# Patient Record
Sex: Female | Born: 1955 | Race: Black or African American | Hispanic: No | Marital: Married | Smoking: Former smoker
Health system: Southern US, Community
[De-identification: ages and names within clinical notes are randomized; demographics above are authoritative.]

## PROBLEM LIST (undated history)

## (undated) DIAGNOSIS — E785 Hyperlipidemia, unspecified: Secondary | ICD-10-CM

## (undated) DIAGNOSIS — H409 Unspecified glaucoma: Secondary | ICD-10-CM

## (undated) DIAGNOSIS — I10 Essential (primary) hypertension: Secondary | ICD-10-CM

## (undated) DIAGNOSIS — E119 Type 2 diabetes mellitus without complications: Secondary | ICD-10-CM

## (undated) DIAGNOSIS — M1990 Unspecified osteoarthritis, unspecified site: Secondary | ICD-10-CM

## (undated) HISTORY — PX: ESOPHAGOGASTRODUODENOSCOPY: SHX1529

## (undated) HISTORY — PX: ABDOMINAL HYSTERECTOMY: SHX81

## (undated) HISTORY — PX: COLONOSCOPY: SHX174

---

## 2004-08-02 ENCOUNTER — Ambulatory Visit: Payer: Self-pay | Admitting: Unknown Physician Specialty

## 2005-08-07 ENCOUNTER — Ambulatory Visit: Payer: Self-pay | Admitting: Unknown Physician Specialty

## 2005-09-09 ENCOUNTER — Ambulatory Visit: Payer: Self-pay | Admitting: Gastroenterology

## 2006-08-27 ENCOUNTER — Ambulatory Visit: Payer: Self-pay | Admitting: Unknown Physician Specialty

## 2007-09-08 ENCOUNTER — Ambulatory Visit: Payer: Self-pay | Admitting: Unknown Physician Specialty

## 2008-09-13 ENCOUNTER — Ambulatory Visit: Payer: Self-pay | Admitting: Unknown Physician Specialty

## 2009-10-04 ENCOUNTER — Ambulatory Visit: Payer: Self-pay | Admitting: Unknown Physician Specialty

## 2009-11-13 ENCOUNTER — Ambulatory Visit: Payer: Self-pay | Admitting: Gastroenterology

## 2010-10-30 ENCOUNTER — Ambulatory Visit: Payer: Self-pay | Admitting: Unknown Physician Specialty

## 2011-12-20 ENCOUNTER — Ambulatory Visit: Payer: Self-pay | Admitting: Unknown Physician Specialty

## 2012-12-21 ENCOUNTER — Ambulatory Visit: Payer: Self-pay | Admitting: Internal Medicine

## 2014-02-02 ENCOUNTER — Ambulatory Visit: Payer: Self-pay | Admitting: Internal Medicine

## 2014-03-28 ENCOUNTER — Ambulatory Visit: Payer: Self-pay | Admitting: Gastroenterology

## 2014-05-02 DIAGNOSIS — I10 Essential (primary) hypertension: Secondary | ICD-10-CM | POA: Insufficient documentation

## 2014-05-23 LAB — SURGICAL PATHOLOGY

## 2014-10-18 ENCOUNTER — Other Ambulatory Visit: Payer: Self-pay | Admitting: Internal Medicine

## 2014-10-18 DIAGNOSIS — E7800 Pure hypercholesterolemia, unspecified: Secondary | ICD-10-CM | POA: Insufficient documentation

## 2014-10-18 DIAGNOSIS — K219 Gastro-esophageal reflux disease without esophagitis: Secondary | ICD-10-CM | POA: Insufficient documentation

## 2014-10-18 DIAGNOSIS — Z794 Long term (current) use of insulin: Secondary | ICD-10-CM | POA: Insufficient documentation

## 2014-10-18 DIAGNOSIS — Z1239 Encounter for other screening for malignant neoplasm of breast: Secondary | ICD-10-CM

## 2015-01-10 DIAGNOSIS — M1611 Unilateral primary osteoarthritis, right hip: Secondary | ICD-10-CM | POA: Insufficient documentation

## 2015-02-06 ENCOUNTER — Ambulatory Visit: Payer: Self-pay

## 2015-02-10 ENCOUNTER — Ambulatory Visit
Admission: RE | Admit: 2015-02-10 | Discharge: 2015-02-10 | Disposition: A | Discharge status: Home / Self Care | Payer: BLUE CROSS/BLUE SHIELD | Source: Ambulatory Visit | Attending: Internal Medicine | Admitting: Internal Medicine

## 2015-02-10 DIAGNOSIS — Z1231 Encounter for screening mammogram for malignant neoplasm of breast: Secondary | ICD-10-CM | POA: Diagnosis not present

## 2015-02-10 DIAGNOSIS — Z1239 Encounter for other screening for malignant neoplasm of breast: Secondary | ICD-10-CM

## 2015-03-04 ENCOUNTER — Encounter: Payer: Self-pay | Admitting: Emergency Medicine

## 2015-03-04 ENCOUNTER — Emergency Department
Admission: EM | Admit: 2015-03-04 | Discharge: 2015-03-04 | Disposition: A | Discharge status: Home / Self Care | Payer: BLUE CROSS/BLUE SHIELD | Attending: Emergency Medicine | Admitting: Emergency Medicine

## 2015-03-04 DIAGNOSIS — I10 Essential (primary) hypertension: Secondary | ICD-10-CM | POA: Diagnosis present

## 2015-03-04 DIAGNOSIS — E119 Type 2 diabetes mellitus without complications: Secondary | ICD-10-CM | POA: Diagnosis not present

## 2015-03-04 HISTORY — DX: Unspecified osteoarthritis, unspecified site: M19.90

## 2015-03-04 HISTORY — DX: Essential (primary) hypertension: I10

## 2015-03-04 HISTORY — DX: Type 2 diabetes mellitus without complications: E11.9

## 2015-03-04 NOTE — ED Provider Notes (Signed)
[facility] Emergency Department Provider Note  ____________________________________________  Time seen: Approximately 1:40 PM  I have reviewed the triage vital signs and the nursing notes.   HISTORY  Chief Complaint Hypertension    HPI Karla Hopkins is a 60 y.o. female patient complaining of blood pressure being elevated in the evening. Patient state associated with a slight headache which resolved on a blood pressure returned back to normal readings. Patient states she is tried to call her doctor left the message but not heard back from the office. Patient denies any chest pain shortness of breath or vision disturbance.Patient denies any pain. Patient has a home risks monitoring apparatus for her blood pressure. Since the patient is normotensive at this time we will compare with additional reading with the ED and home monitoring apparatus. .  Past Medical History  Diagnosis Date  . Hypertension   . Diabetes mellitus without complication ([place])   . Arthritis     There are no active problems to display for this patient.   Past Surgical History  Procedure Laterality Date  . Abdominal hysterectomy      No current outpatient prescriptions on file.  Allergies Review of patient's allergies indicates no known allergies.  Family History  Problem Relation Age of Onset  . Pancreatic cancer Mother   . Lung cancer Father   . Breast cancer Sister 60  . Breast cancer Sister 73  . Breast cancer Sister 7  . Lung cancer Brother     Social History Social History  Substance Use Topics  . Smoking status: Never Smoker   . Smokeless tobacco: None  . Alcohol Use: No    Review of Systems Constitutional: No fever/chills Eyes: No visual changes. ENT: No sore throat. Cardiovascular: Denies chest pain. Respiratory: Denies shortness of breath. Gastrointestinal: No abdominal pain.  No nausea, no vomiting.  No diarrhea.  No constipation. Genitourinary:  Negative for dysuria. Musculoskeletal: Negative for back pain. Skin: Negative for rash. Neurological: Negative for headaches, focal weakness or numbness. Endocrine: Diabetes and hypertension ____________________________________________   PHYSICAL EXAM:  VITAL SIGNS: ED Triage Vitals  Enc Vitals Group     BP 03/04/15 1230 186/79 mmHg     Pulse Rate 03/04/15 1230 88     Resp 03/04/15 1230 18     Temp 03/04/15 1230 97.5 F (36.4 C)     Temp Source 03/04/15 1230 Oral     SpO2 03/04/15 1230 100 %     Weight 03/04/15 1230 188 lb (85.276 kg)     Height 03/04/15 1230 5' (1.524 m)     Head Cir --      Peak Flow --      Pain Score --      Pain Loc --      Pain Edu? --      Excl. in [place]? --     Constitutional: Alert and oriented. Well appearing and in no acute distress. Eyes: Conjunctivae are normal. PERRL. EOMI. Head: Atraumatic. Nose: No congestion/rhinnorhea. Mouth/Throat: Mucous membranes are moist.  Oropharynx non-erythematous. Neck: No stridor.  No cervical spine tenderness to palpation. Hematological/Lymphatic/Immunilogical: No cervical lymphadenopathy. Cardiovascular: Normal rate, regular rhythm. Grossly normal heart sounds.  Good peripheral circulation. Respiratory: Normal respiratory effort.  No retractions. Lungs CTAB. Gastrointestinal: Soft and nontender. No distention. No abdominal bruits. No CVA tenderness. Musculoskeletal: No lower extremity tenderness nor edema.  No joint effusions. Neurologic:  Normal speech and language. No gross focal neurologic deficits are appreciated. No gait instability. Skin:  Skin is warm, dry and intact. No rash noted. Psychiatric: Mood and affect are normal. Speech and behavior are normal.  ____________________________________________   LABS (all labs ordered are listed, but only abnormal results are displayed)  Labs Reviewed - No data to  display ____________________________________________  EKG   ____________________________________________  RADIOLOGY  ____________________________________________   PROCEDURES  Procedure(s) performed: None  Critical Care performed: No  ____________________________________________   INITIAL IMPRESSION / ASSESSMENT AND PLAN / ED COURSE  Pertinent labs & imaging results that were available during my care of the patient were reviewed by me and considered in my medical decision making (see chart for details).  Hypertension. Advised patient her readings are high on a home machine secondary to the pain is too narrow for her extremity. Advised patient to follow-up with family doctor and continue previous medications. Discharge instructions given and discharged BP is 147/80. ____________________________________________   FINAL CLINICAL IMPRESSION(S) / ED DIAGNOSES  Final diagnoses:  Essential hypertension      Sable Feil, PA-C 03/04/15 1416  Lisa Roca, MD 03/04/15 [phone]

## 2015-03-04 NOTE — ED Notes (Signed)
bp 147/80 after sitting in triage a few minutes.

## 2015-03-04 NOTE — ED Notes (Addendum)
Per PA Ron I checked blood pressure with patient's home wrist machine, and then our  BP machine.  Patient's home wrist machine was 198/96, and taken on forearm with patient's machine was 205/80, letting her know she should not attempt to take it on her forearm.  Our machine showed with small cuff wrapped tight like her wrist machine was 183/91.  Our machine on patient upper arm with proper sized cuff was 162/73.  Out come was determined that patient needs a larger wrist home BP machine for more accurate reading.

## 2015-03-04 NOTE — Discharge Instructions (Signed)
Continue previous medications and follow-up family doctor How to Take Your Blood Pressure HOW DO I GET A BLOOD PRESSURE MACHINE?  You can buy an electronic home blood pressure machine at your local pharmacy. Insurance will sometimes cover the cost if you have a prescription.  Ask your doctor what type of machine is best for you. There are different machines for your arm and your wrist.  If you decide to buy a machine to check your blood pressure on your arm, first check the size of your arm so you can buy the right size cuff. To check the size of your arm:   Use a measuring tape that shows both inches and centimeters.   Wrap the measuring tape around the upper-middle part of your arm. You may need someone to help you measure.   Write down your arm measurement in both inches and centimeters.   To measure your blood pressure correctly, it is important to have the right size cuff.   If your arm is up to 13 inches (up to 34 centimeters), get an adult cuff size.  If your arm is 13 to 17 inches (35 to 44 centimeters), get a large adult cuff size.    If your arm is 17 to 20 inches (45 to 52 centimeters), get an adult thigh cuff.  WHAT DO THE NUMBERS MEAN?   There are two numbers that make up your blood pressure. For example: 120/80.  The first number (120 in our example) is called the "systolic pressure." It is a measure of the pressure in your blood vessels when your heart is pumping blood.  The second number (80 in our example) is called the "diastolic pressure." It is a measure of the pressure in your blood vessels when your heart is resting between beats.  Your doctor will tell you what your blood pressure should be. WHAT SHOULD I DO BEFORE I CHECK MY BLOOD PRESSURE?   Try to rest or relax for at least 30 minutes before you check your blood pressure.  Do not smoke.  Do not have any drinks with caffeine, such as:  Soda.  Coffee.  Tea.  Check your blood pressure in a  quiet room.  Sit down and stretch out your arm on a table. Keep your arm at about the level of your heart. Let your arm relax.  Make sure that your legs are not crossed. HOW DO I CHECK MY BLOOD PRESSURE?  Follow the directions that came with your machine.  Make sure you remove any tight-fitting clothing from your arm or wrist. Wrap the cuff around your upper arm or wrist. You should be able to fit a finger between the cuff and your arm. If you cannot fit a finger between the cuff and your arm, it is too tight and should be removed and rewrapped.  Some units require you to manually pump up the arm cuff.  Automatic units inflate the cuff when you press a button.  Cuff deflation is automatic in both models.  After the cuff is inflated, the unit measures your blood pressure and pulse. The readings are shown on a monitor. Hold still and breathe normally while the cuff is inflated.  Getting a reading takes less than a minute.  Some models store readings in a memory. Some provide a printout of readings. If your machine does not store your readings, keep a written record.  Take readings with you to your next visit with your doctor.   This information is  not intended to replace advice given to you by your health care provider. Make sure you discuss any questions you have with your health care provider.   Document Released: 12/28/2007 Document Revised: 02/04/2014 Document Reviewed: 03/11/2013 Elsevier Interactive Patient Education [employer].

## 2015-03-04 NOTE — ED Notes (Signed)
C/o blood pressure being elevated in the evenings.  Will have slight headache in evening with elevation and then it comes down.  Called doctor during week but did not hear back.  Denies CP, SHOB, or vision changes.

## 2015-05-02 ENCOUNTER — Emergency Department
Admission: EM | Admit: 2015-05-02 | Discharge: 2015-05-02 | Disposition: A | Discharge status: Home / Self Care | Payer: Worker's Compensation | Attending: Emergency Medicine | Admitting: Emergency Medicine

## 2015-05-02 ENCOUNTER — Emergency Department: Payer: Worker's Compensation

## 2015-05-02 ENCOUNTER — Encounter: Payer: Self-pay | Admitting: Emergency Medicine

## 2015-05-02 DIAGNOSIS — M1990 Unspecified osteoarthritis, unspecified site: Secondary | ICD-10-CM | POA: Diagnosis not present

## 2015-05-02 DIAGNOSIS — I10 Essential (primary) hypertension: Secondary | ICD-10-CM | POA: Insufficient documentation

## 2015-05-02 DIAGNOSIS — Y9269 Other specified industrial and construction area as the place of occurrence of the external cause: Secondary | ICD-10-CM | POA: Diagnosis not present

## 2015-05-02 DIAGNOSIS — Y9389 Activity, other specified: Secondary | ICD-10-CM | POA: Diagnosis not present

## 2015-05-02 DIAGNOSIS — Y990 Civilian activity done for income or pay: Secondary | ICD-10-CM | POA: Insufficient documentation

## 2015-05-02 DIAGNOSIS — S63512A Sprain of carpal joint of left wrist, initial encounter: Secondary | ICD-10-CM | POA: Diagnosis not present

## 2015-05-02 DIAGNOSIS — E119 Type 2 diabetes mellitus without complications: Secondary | ICD-10-CM | POA: Diagnosis not present

## 2015-05-02 DIAGNOSIS — Z79899 Other long term (current) drug therapy: Secondary | ICD-10-CM | POA: Diagnosis not present

## 2015-05-02 DIAGNOSIS — S63502A Unspecified sprain of left wrist, initial encounter: Secondary | ICD-10-CM

## 2015-05-02 DIAGNOSIS — W010XXA Fall on same level from slipping, tripping and stumbling without subsequent striking against object, initial encounter: Secondary | ICD-10-CM | POA: Insufficient documentation

## 2015-05-02 DIAGNOSIS — Z90710 Acquired absence of both cervix and uterus: Secondary | ICD-10-CM | POA: Diagnosis not present

## 2015-05-02 DIAGNOSIS — S6992XA Unspecified injury of left wrist, hand and finger(s), initial encounter: Secondary | ICD-10-CM | POA: Diagnosis present

## 2015-05-02 DIAGNOSIS — Z7984 Long term (current) use of oral hypoglycemic drugs: Secondary | ICD-10-CM | POA: Insufficient documentation

## 2015-05-02 NOTE — ED Notes (Signed)
See triage note   States she fell yesterday at work  Having pain to left wrist area  Denies any other sx's   Ambulates well to treatment area

## 2015-05-02 NOTE — ED Notes (Signed)
Pt to ed with c/o left wrist pain,  Pt states she fell while at work yesterday.

## 2015-05-02 NOTE — ED Provider Notes (Signed)
[facility] Emergency Department Provider Note  ____________________________________________  Time seen: Approximately 12:30 PM  I have reviewed the triage vital signs and the nursing notes.   HISTORY  Chief Complaint Fall    HPI Karla Hopkins is a 60 y.o. female patient with left his pain secondary to fall yesterday. 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 Patient tripped and fell. Patient broke the fall with left hand and wrist. Patient stated this pain radiating from the ulnar aspect of her wrist to mid forearm. Patient denies any loss of sensation. Patient denies any loss of function. Patient rates pain as 7/10. Patient described a pain as "sharp". No palliative measures taken for this complaint.   Past Medical History  Diagnosis Date  . Hypertension   . Diabetes mellitus without complication ([place])   . Arthritis     There are no active problems to display for this patient.   Past Surgical History  Procedure Laterality Date  . Abdominal hysterectomy      Current Outpatient Rx  Name  Route  Sig  Dispense  Refill  . doxazosin (CARDURA) 8 MG tablet   Oral   Take 8 mg by mouth daily.         . hydrochlorothiazide (HYDRODIURIL) 25 MG tablet   Oral   Take 25 mg by mouth daily.         Marland Kitchen lisinopril (PRINIVIL,ZESTRIL) 40 MG tablet   Oral   Take 40 mg by mouth daily.         . metFORMIN (GLUCOPHAGE) 1000 MG tablet   Oral   Take 1,000 mg by mouth 2 (two) times daily with a meal.         . verapamil (VERELAN PM) 180 MG 24 hr capsule   Oral   Take 180 mg by mouth at bedtime.         . meloxicam (MOBIC) 7.5 MG tablet   Oral   Take 7.5 mg by mouth daily.           Allergies Review of patient's allergies indicates no known allergies.  Family History  Problem Relation Age of Onset  . Pancreatic cancer Mother   . Lung cancer Father   . Breast cancer Sister 14  . Breast cancer Sister 68   . Breast cancer Sister 80  . Lung cancer Brother     Social History Social History  Substance Use Topics  . Smoking status: Never Smoker   . Smokeless tobacco: None  . Alcohol Use: No    Review of Systems Constitutional: No fever/chills Eyes: No visual changes. ENT: No sore throat. Cardiovascular: Denies chest pain. Respiratory: Denies shortness of breath. Gastrointestinal: No abdominal pain.  No nausea, no vomiting.  No diarrhea.  No constipation. Genitourinary: Negative for dysuria. Musculoskeletal: Left wrist pain  Skin: Negative for rash. Neurological: Negative for headaches, focal weakness or numbness. Endocrine:Hypertension and diabetes ____________________________________________   PHYSICAL EXAM:  VITAL SIGNS: ED Triage Vitals  Enc Vitals Group     BP 05/02/15 1216 176/66 mmHg     Pulse Rate 05/02/15 1216 68     Resp 05/02/15 1216 18     Temp 05/02/15 1216 98.1 F (36.7 C)     Temp Source 05/02/15 1216 Oral     SpO2 05/02/15 1216 100 %     Weight 05/02/15 1216 188 lb (85.276 kg)     Height 05/02/15 1216 5\' 1"  (1.549 m)  Head Cir --      Peak Flow --      Pain Score 05/02/15 1216 7     Pain Loc --      Pain Edu? --      Excl. in [place]? --     Constitutional: Alert and oriented. Well appearing and in no acute distress. Eyes: Conjunctivae are normal. PERRL. EOMI. Head: Atraumatic. Nose: No congestion/rhinnorhea. Mouth/Throat: Mucous membranes are moist.  Oropharynx non-erythematous. Neck: No stridor. No cervical spine tenderness to palpation. Hematological/Lymphatic/Immunilogical: No cervical lymphadenopathy. Cardiovascular: Normal rate, regular rhythm. Grossly normal heart sounds.  Good peripheral circulation. Respiratory: Normal respiratory effort.  No retractions. Lungs CTAB. Gastrointestinal: Soft and nontender. No distention. No abdominal bruits. No CVA tenderness. Musculoskeletal: No obvious deformity of the left wrist. Mild edema. Patient has  moderate guarding palpation of distal ulnar. Patient is full equal range of motion. Grip strength is decreased 3/5. Neurologic:  Normal speech and language. No gross focal neurologic deficits are appreciated. No gait instability. Skin:  Skin is warm, dry and intact. No rash noted. Psychiatric: Mood and affect are normal. Speech and behavior are normal.  ____________________________________________   LABS (all labs ordered are listed, but only abnormal results are displayed)  Labs Reviewed - No data to display ____________________________________________  EKG   ____________________________________________  RADIOLOGY  No acute findings on x-ray. I, Sable Feil, personally viewed and evaluated these images (plain radiographs) as part of my medical decision making, as well as reviewing the written report by the radiologist.  ____________________________________________   PROCEDURES  Procedure(s) performed: None  Critical Care performed: No  ____________________________________________   INITIAL IMPRESSION / ASSESSMENT AND PLAN / ED COURSE  Pertinent labs & imaging results that were available during my care of the patient were reviewed by me and considered in my medical decision making (see chart for details).  Contusion right wrist. Discussed x-ray finding with patient. Patient placed in a Velcro wrist splint. Patient given discharge Instructions. Patient advised follow-up with family doctor no improvement 3-5 days. ____________________________________________   FINAL CLINICAL IMPRESSION(S) / ED DIAGNOSES  Final diagnoses:  Sprain of left wrist, initial encounter      Sable Feil, PA-C 05/02/15 [place], PA-C 05/02/15 1340  Lavonia Drafts, MD 05/02/15 [phone]

## 2015-05-02 NOTE — Discharge Instructions (Signed)
Wear wrist support for 3-5 days as needed.

## 2016-01-11 ENCOUNTER — Other Ambulatory Visit: Payer: Self-pay | Admitting: Internal Medicine

## 2016-01-11 DIAGNOSIS — Z1239 Encounter for other screening for malignant neoplasm of breast: Secondary | ICD-10-CM

## 2016-02-16 ENCOUNTER — Ambulatory Visit
Admission: RE | Admit: 2016-02-16 | Discharge: 2016-02-16 | Disposition: A | Discharge status: Home / Self Care | Payer: BLUE CROSS/BLUE SHIELD | Source: Ambulatory Visit | Attending: Internal Medicine | Admitting: Internal Medicine

## 2016-02-16 DIAGNOSIS — Z1231 Encounter for screening mammogram for malignant neoplasm of breast: Secondary | ICD-10-CM | POA: Insufficient documentation

## 2016-02-16 DIAGNOSIS — Z1239 Encounter for other screening for malignant neoplasm of breast: Secondary | ICD-10-CM

## 2016-10-23 DIAGNOSIS — N951 Menopausal and female climacteric states: Secondary | ICD-10-CM | POA: Insufficient documentation

## 2016-10-23 DIAGNOSIS — I10 Essential (primary) hypertension: Secondary | ICD-10-CM | POA: Insufficient documentation

## 2017-01-31 DIAGNOSIS — M1990 Unspecified osteoarthritis, unspecified site: Secondary | ICD-10-CM | POA: Insufficient documentation

## 2017-01-31 DIAGNOSIS — E1165 Type 2 diabetes mellitus with hyperglycemia: Secondary | ICD-10-CM | POA: Insufficient documentation

## 2017-01-31 DIAGNOSIS — Z90710 Acquired absence of both cervix and uterus: Secondary | ICD-10-CM | POA: Insufficient documentation

## 2017-01-31 DIAGNOSIS — E782 Mixed hyperlipidemia: Secondary | ICD-10-CM | POA: Insufficient documentation

## 2017-02-04 ENCOUNTER — Other Ambulatory Visit: Payer: Self-pay | Admitting: Nurse Practitioner

## 2017-02-04 DIAGNOSIS — Z1231 Encounter for screening mammogram for malignant neoplasm of breast: Secondary | ICD-10-CM

## 2017-06-10 DIAGNOSIS — M25561 Pain in right knee: Secondary | ICD-10-CM | POA: Insufficient documentation

## 2017-07-08 DIAGNOSIS — R61 Generalized hyperhidrosis: Secondary | ICD-10-CM | POA: Insufficient documentation

## 2018-01-29 ENCOUNTER — Other Ambulatory Visit: Payer: Self-pay | Admitting: Nurse Practitioner

## 2018-01-29 DIAGNOSIS — Z1231 Encounter for screening mammogram for malignant neoplasm of breast: Secondary | ICD-10-CM

## 2018-02-10 DIAGNOSIS — Z6834 Body mass index (BMI) 34.0-34.9, adult: Secondary | ICD-10-CM | POA: Insufficient documentation

## 2018-04-21 DIAGNOSIS — J309 Allergic rhinitis, unspecified: Secondary | ICD-10-CM | POA: Insufficient documentation

## 2018-08-25 DIAGNOSIS — Z0000 Encounter for general adult medical examination without abnormal findings: Secondary | ICD-10-CM | POA: Insufficient documentation

## 2019-06-07 ENCOUNTER — Other Ambulatory Visit (INDEPENDENT_AMBULATORY_CARE_PROVIDER_SITE_OTHER): Payer: Self-pay | Admitting: Vascular Surgery

## 2019-06-07 DIAGNOSIS — I10 Essential (primary) hypertension: Secondary | ICD-10-CM

## 2019-06-08 ENCOUNTER — Other Ambulatory Visit (INDEPENDENT_AMBULATORY_CARE_PROVIDER_SITE_OTHER): Payer: Self-pay | Admitting: Nurse Practitioner

## 2019-06-08 ENCOUNTER — Encounter (INDEPENDENT_AMBULATORY_CARE_PROVIDER_SITE_OTHER): Payer: Self-pay | Admitting: Nurse Practitioner

## 2019-06-08 ENCOUNTER — Ambulatory Visit (INDEPENDENT_AMBULATORY_CARE_PROVIDER_SITE_OTHER): Payer: Medicare HMO | Admitting: Nurse Practitioner

## 2019-06-08 ENCOUNTER — Ambulatory Visit (INDEPENDENT_AMBULATORY_CARE_PROVIDER_SITE_OTHER): Payer: Medicare HMO

## 2019-06-08 ENCOUNTER — Other Ambulatory Visit: Payer: Self-pay

## 2019-06-08 VITALS — BP 168/91 | HR 62 | Resp 16 | Ht 62.0 in | Wt 195.0 lb

## 2019-06-08 DIAGNOSIS — I10 Essential (primary) hypertension: Secondary | ICD-10-CM

## 2019-06-08 DIAGNOSIS — R0989 Other specified symptoms and signs involving the circulatory and respiratory systems: Secondary | ICD-10-CM

## 2019-06-08 DIAGNOSIS — I771 Stricture of artery: Secondary | ICD-10-CM

## 2019-06-08 DIAGNOSIS — E7800 Pure hypercholesterolemia, unspecified: Secondary | ICD-10-CM

## 2019-06-08 DIAGNOSIS — I701 Atherosclerosis of renal artery: Secondary | ICD-10-CM | POA: Diagnosis not present

## 2019-06-08 NOTE — Progress Notes (Signed)
Subjective:    Patient ID: Karla Hopkins, female    DOB: 10/06/1955, 64 y.o.   MRN: [id number] Chief Complaint  Patient presents with  . New Patient (Initial Visit)    ref [place] uncontrolled HTN    Patient presents today as a referral from Dr. Clayborn Bigness in regards to poorly controlled hypertension.  The patient states that she has been on 3 blood pressure medications without any change in her blood pressure.  The patient does note that this has been ongoing for years.  The patient denies any known issues with her renal function.  Patient also does note that sometimes she has different blood pressures in each arm.  Today the patient's right lower extremity have blood pressures in the 99991111 systolic whereas the left was [id number] systolic.  Patient denies smoking or tobacco usage.  She denies any fever, chills, nausea, vomiting or diarrhea.  She denies any strokelike symptoms.  She denies any TIA-like symptoms.  She denies any claudication or rest pain like symptoms.  Today noninvasive studies show that the patient has normal size kidneys bilaterally.  She has an aortic diameter of 2.2 cm.  The studies estimate the renal artery stenosis to be between 1 and 59%.  However the highest velocity bilaterally is less than 150.   Review of Systems  All other systems reviewed and are negative.      Objective:   Physical Exam Vitals reviewed.  Constitutional:      Appearance: Normal appearance.  Neck:     Vascular: Carotid bruit present.  Cardiovascular:     Rate and Rhythm: Normal rate and regular rhythm.     Pulses: Normal pulses.     Heart sounds: Normal heart sounds.  Pulmonary:     Effort: Pulmonary effort is normal.     Breath sounds: Normal breath sounds.  Skin:    General: Skin is warm and dry.  Neurological:     Mental Status: She is alert and oriented to person, place, and time.  Psychiatric:        Mood and Affect: Mood normal.        Behavior: Behavior normal.        Thought  Content: Thought content normal.        Judgment: Judgment normal.     BP (!) 168/91 (BP Location: Left Arm)   Pulse 62   Resp 16   Ht 5\' 2"  (1.575 m)   Wt 195 lb (88.5 kg)   BMI 35.67 kg/m   Past Medical History:  Diagnosis Date  . Arthritis   . Diabetes mellitus without complication ([place])   . Hypertension     Social History   Socioeconomic History  . Marital status: Married    Spouse name: Not on file  . Number of children: Not on file  . Years of education: Not on file  . Highest education level: Not on file  Occupational History  . Not on file  Tobacco Use  . Smoking status: Never Smoker  . Smokeless tobacco: Never Used  Substance and Sexual Activity  . Alcohol use: No  . Drug use: No  . Sexual activity: Not on file  Other Topics Concern  . Not on file  Social History Narrative  . Not on file   Social Determinants of Health   Financial Resource Strain:   . Difficulty of Paying Living Expenses:   Food Insecurity:   . Worried About Charity fundraiser in the Last Year:   .  Ran Out of Food in the Last Year:   Transportation Needs:   . Film/video editor (Medical):   Marland Kitchen Lack of Transportation (Non-Medical):   Physical Activity:   . Days of Exercise per Week:   . Minutes of Exercise per Session:   Stress:   . Feeling of Stress :   Social Connections:   . Frequency of Communication with Friends and Family:   . Frequency of Social Gatherings with Friends and Family:   . Attends Religious Services:   . Active Member of Clubs or Organizations:   . Attends Archivist Meetings:   Marland Kitchen Marital Status:   Intimate Partner Violence:   . Fear of Current or Ex-Partner:   . Emotionally Abused:   Marland Kitchen Physically Abused:   . Sexually Abused:     Past Surgical History:  Procedure Laterality Date  . ABDOMINAL HYSTERECTOMY      Family History  Problem Relation Age of Onset  . Pancreatic cancer Mother   . Lung cancer Father   . Breast cancer Sister  74  . Breast cancer Sister 63  . Breast cancer Sister 35  . Lung cancer Brother     No Known Allergies     Assessment & Plan:   1. Renal artery stenosis (HCC) Based on the patient's noninvasive studies she has some evidence of renal atherosclerosis however it is likely less than 50%.  Based on the patient's velocities with her noninvasive studies it is likely that intervention would not be beneficial in treating the patient's hypertensive disease.  However we will continue to monitor the patient on an annual basis with renal artery duplex.   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  This should confirm subclavian artery stenosis.  However, the patient is currently not having any issues with upper extremity pain, numbness or discomfort so at this time we will treat it conservatively with monitoring.  3. Bilateral carotid bruits The patient states that she has a carotid duplex scheduled with her cardiologist.  We will allow them to follow-up with her carotid artery duplex.  If evidence of severe stenosis is found which requires intervention they will reach out to our office for follow-up.  4. Essential hypertension Continue antihypertensive medications as already ordered, these medications have been reviewed and there are no changes at this time.   5. Pure hypercholesterolemia Continue statin as ordered and reviewed, no changes at this time    Current Outpatient Medications on File Prior to Visit  Medication Sig Dispense Refill  . aspirin 81 MG EC tablet Take by mouth.    . Cholecalciferol 10 MCG (400 UNIT) CAPS Take by mouth.    . cyanocobalamin 1000 MCG tablet Take by mouth.    . doxazosin (CARDURA) 8 MG tablet Take 8 mg by mouth daily.    . ferrous sulfate 325 (65 FE) MG tablet Take by mouth.    . fexofenadine (ALLEGRA) 180 MG tablet daily    . fluticasone (FLONASE) 50 MCG/ACT nasal spray Place into the  nose.    . hydrALAZINE (APRESOLINE) 50 MG tablet     . hydrochlorothiazide (HYDRODIURIL) 25 MG tablet Take 25 mg by mouth daily.    . insulin glargine (LANTUS SOLOSTAR) 100 UNIT/ML Solostar Pen INJECT 45 UNITS SUBCUTANEOUSLY NIGHTLY    . lisinopril (PRINIVIL,ZESTRIL) 40 MG tablet Take 40 mg by mouth daily.    . meloxicam (MOBIC) 7.5 MG tablet Take 7.5  mg by mouth daily.    . metFORMIN (GLUCOPHAGE) 1000 MG tablet Take 1,000 mg by mouth 2 (two) times daily with a meal.    . Multiple Vitamin (MULTIVITAMIN) capsule Take by mouth.    Glory Rosebush Delica Lancets 99991111 MISC Use 1 each as directed.    . pantoprazole (PROTONIX) 40 MG tablet TAKE 1 TABLET BY MOUTH ONCE DAILY    . Pediatric Multivit-Minerals-C (FLINTSTONES GUMMIES) chewable tablet Chew by mouth.    . pravastatin (PRAVACHOL) 20 MG tablet     . traMADol (ULTRAM) 50 MG tablet     . verapamil (CALAN-SR) 240 MG CR tablet Take by mouth.    . BETIMOL 0.5 % ophthalmic solution Apply 1 drop to eye every morning.    Marland Kitchen glucose blood (ACCU-CHEK AVIVA PLUS) test strip USE TEST STRIP(S) TO CHECK GLUCOSE ONCE DAILY    . verapamil (VERELAN PM) 180 MG 24 hr capsule Take 180 mg by mouth at bedtime.     No current facility-administered medications on file prior to visit.    There are no Patient Instructions on file for this visit. No follow-ups on file.   Kris Hartmann, NP

## 2019-06-24 ENCOUNTER — Telehealth (INDEPENDENT_AMBULATORY_CARE_PROVIDER_SITE_OTHER): Payer: Self-pay | Admitting: Gastroenterology

## 2019-06-24 DIAGNOSIS — Z1211 Encounter for screening for malignant neoplasm of colon: Secondary | ICD-10-CM

## 2019-06-24 NOTE — Progress Notes (Signed)
Gastroenterology Pre-Procedure Review  Request Date: Tuesday 07/13/19 Requesting Physician: Dr. Vicente Males  PATIENT REVIEW QUESTIONS: The patient responded to the following health history questions as indicated:    1. Are you having any GI issues? yes (Indigestion at bedtime) 2. Do you have a personal history of Polyps? yes (unsure of the year of her last colonoscopy.  however it was done at [facility].  Did not see a record of the colonoscopy report.) 3. Do you have a family history of Colon Cancer or Polyps? no 4. Diabetes Mellitus? yes (type 2) 5. Joint replacements in the past 12 months?no 6. Major health problems in the past 3 months?no 7. Any artificial heart valves, MVP, or defibrillator?no    MEDICATIONS & ALLERGIES:    Patient reports the following regarding taking any anticoagulation/antiplatelet therapy:   Plavix, Coumadin, Eliquis, Xarelto, Lovenox, Pradaxa, Brilinta, or Effient? no Aspirin? Yes 81 mg daily  Patient confirms/reports the following medications:  Current Outpatient Medications  Medication Sig Dispense Refill  . aspirin 81 MG EC tablet Take by mouth.    . BETIMOL 0.5 % ophthalmic solution Apply 1 drop to eye every morning.    . Cholecalciferol 10 MCG (400 UNIT) CAPS Take by mouth.    . cyanocobalamin 1000 MCG tablet Take by mouth.    . doxazosin (CARDURA) 8 MG tablet Take 8 mg by mouth daily.    . fexofenadine (ALLEGRA) 180 MG tablet daily    . glucose blood (ACCU-CHEK AVIVA PLUS) test strip USE TEST STRIP(S) TO CHECK GLUCOSE ONCE DAILY    . hydrALAZINE (APRESOLINE) 50 MG tablet     . hydrochlorothiazide (HYDRODIURIL) 25 MG tablet Take 25 mg by mouth daily.    . insulin glargine (LANTUS SOLOSTAR) 100 UNIT/ML Solostar Pen INJECT 45 UNITS SUBCUTANEOUSLY NIGHTLY    . lisinopril (PRINIVIL,ZESTRIL) 40 MG tablet Take 40 mg by mouth daily.    . metFORMIN (GLUCOPHAGE) 1000 MG tablet Take 1,000 mg by mouth 2 (two) times daily with a meal.    . Multiple Vitamin  (MULTIVITAMIN) capsule Take by mouth.    Glory Rosebush Delica Lancets 99991111 MISC Use 1 each as directed.    . pravastatin (PRAVACHOL) 20 MG tablet     . verapamil (VERELAN) 100 MG 24 hr capsule Take 100 mg by mouth in the morning and at bedtime.    . ferrous sulfate 325 (65 FE) MG tablet Take by mouth.    . fluticasone (FLONASE) 50 MCG/ACT nasal spray Place into the nose.    . pantoprazole (PROTONIX) 40 MG tablet TAKE 1 TABLET BY MOUTH ONCE DAILY    . Pediatric Multivit-Minerals-C (FLINTSTONES GUMMIES) chewable tablet Chew by mouth.     No current facility-administered medications for this visit.    Patient confirms/reports the following allergies:  No Known Allergies  Orders Placed This Encounter  Procedures  . Procedural/ Surgical Case Request: COLONOSCOPY WITH PROPOFOL    Standing Status:   Standing    Number of Occurrences:   1    Order Specific Question:   Pre-op diagnosis    Answer:   screening colonoscopy    Order Specific Question:   CPT Code    Answer:   [id number]    AUTHORIZATION INFORMATION Primary Insurance: 1D#: Group #:  Secondary Insurance: 1D#: Group #:  SCHEDULE INFORMATION: Date: Tuesday 07/13/19 Time: Location:[facility]

## 2019-06-26 ENCOUNTER — Other Ambulatory Visit: Payer: Self-pay | Admitting: Student

## 2019-06-26 DIAGNOSIS — I10 Essential (primary) hypertension: Secondary | ICD-10-CM

## 2019-07-06 ENCOUNTER — Ambulatory Visit: Payer: Medicare HMO

## 2019-07-08 ENCOUNTER — Ambulatory Visit: Payer: Medicare HMO

## 2019-07-09 ENCOUNTER — Other Ambulatory Visit
Admission: RE | Admit: 2019-07-09 | Discharge: 2019-07-09 | Disposition: A | Discharge status: Home / Self Care | Payer: Medicare HMO | Source: Ambulatory Visit | Attending: Gastroenterology | Admitting: Gastroenterology

## 2019-07-09 ENCOUNTER — Other Ambulatory Visit: Payer: Self-pay

## 2019-07-09 DIAGNOSIS — Z20822 Contact with and (suspected) exposure to covid-19: Secondary | ICD-10-CM | POA: Diagnosis not present

## 2019-07-09 DIAGNOSIS — Z01812 Encounter for preprocedural laboratory examination: Secondary | ICD-10-CM | POA: Diagnosis present

## 2019-07-09 LAB — SARS CORONAVIRUS 2 (TAT 6-24 HRS): SARS Coronavirus 2: NEGATIVE

## 2019-07-13 ENCOUNTER — Other Ambulatory Visit: Payer: Self-pay

## 2019-07-13 ENCOUNTER — Encounter: Payer: Self-pay | Admitting: Gastroenterology

## 2019-07-13 ENCOUNTER — Encounter
Admission: RE | Disposition: A | Discharge status: Home / Self Care | Payer: Self-pay | Source: Home / Self Care | Attending: Gastroenterology

## 2019-07-13 ENCOUNTER — Ambulatory Visit
Admission: RE | Admit: 2019-07-13 | Discharge: 2019-07-13 | Disposition: A | Discharge status: Home / Self Care | Payer: Medicare HMO | Attending: Gastroenterology | Admitting: Gastroenterology

## 2019-07-13 ENCOUNTER — Ambulatory Visit: Payer: Medicare HMO | Admitting: Anesthesiology

## 2019-07-13 DIAGNOSIS — D122 Benign neoplasm of ascending colon: Secondary | ICD-10-CM | POA: Insufficient documentation

## 2019-07-13 DIAGNOSIS — K635 Polyp of colon: Secondary | ICD-10-CM | POA: Diagnosis not present

## 2019-07-13 DIAGNOSIS — D126 Benign neoplasm of colon, unspecified: Secondary | ICD-10-CM

## 2019-07-13 DIAGNOSIS — Z7982 Long term (current) use of aspirin: Secondary | ICD-10-CM | POA: Insufficient documentation

## 2019-07-13 DIAGNOSIS — I10 Essential (primary) hypertension: Secondary | ICD-10-CM | POA: Diagnosis not present

## 2019-07-13 DIAGNOSIS — D128 Benign neoplasm of rectum: Secondary | ICD-10-CM | POA: Insufficient documentation

## 2019-07-13 DIAGNOSIS — Z79899 Other long term (current) drug therapy: Secondary | ICD-10-CM | POA: Diagnosis not present

## 2019-07-13 DIAGNOSIS — Z794 Long term (current) use of insulin: Secondary | ICD-10-CM | POA: Diagnosis not present

## 2019-07-13 DIAGNOSIS — K219 Gastro-esophageal reflux disease without esophagitis: Secondary | ICD-10-CM | POA: Insufficient documentation

## 2019-07-13 DIAGNOSIS — Z87891 Personal history of nicotine dependence: Secondary | ICD-10-CM | POA: Insufficient documentation

## 2019-07-13 DIAGNOSIS — Z1211 Encounter for screening for malignant neoplasm of colon: Secondary | ICD-10-CM

## 2019-07-13 DIAGNOSIS — E785 Hyperlipidemia, unspecified: Secondary | ICD-10-CM | POA: Insufficient documentation

## 2019-07-13 DIAGNOSIS — E119 Type 2 diabetes mellitus without complications: Secondary | ICD-10-CM | POA: Diagnosis not present

## 2019-07-13 DIAGNOSIS — H409 Unspecified glaucoma: Secondary | ICD-10-CM | POA: Diagnosis not present

## 2019-07-13 HISTORY — DX: Hyperlipidemia, unspecified: E78.5

## 2019-07-13 HISTORY — PX: COLONOSCOPY WITH PROPOFOL: SHX5780

## 2019-07-13 HISTORY — DX: Unspecified glaucoma: H40.9

## 2019-07-13 LAB — GLUCOSE, CAPILLARY: Glucose-Capillary: 113 mg/dL — ABNORMAL HIGH (ref 70–99)

## 2019-07-13 SURGERY — COLONOSCOPY WITH PROPOFOL
Anesthesia: General

## 2019-07-13 MED ORDER — PROPOFOL 500 MG/50ML IV EMUL
INTRAVENOUS | Status: DC | PRN
  Administered 2019-07-13: 140 ug/kg/min via INTRAVENOUS

## 2019-07-13 MED ORDER — SODIUM CHLORIDE 0.9 % IV SOLN: INTRAVENOUS | Status: DC

## 2019-07-13 MED ORDER — PROPOFOL 500 MG/50ML IV EMUL
INTRAVENOUS | Status: AC
  Filled 2019-07-13: qty 50

## 2019-07-13 NOTE — Op Note (Signed)
[facility] Gastroenterology Patient Name: Karla Hopkins Procedure Date: 07/13/2019 7:31 AM MRN: [id number] Account #: [id number] Date of Birth: July 06, 1955 Admit Type: Outpatient Age: 64 Room: [facility] ENDO ROOM 1 Gender: Female Note Status: Finalized Procedure:             Colonoscopy Indications:           Screening for colorectal malignant neoplasm Providers:             Jonathon Bellows MD, MD Medicines:             Monitored Anesthesia Care Complications:         No immediate complications. Procedure:             Pre-Anesthesia Assessment:                        - Prior to the procedure, a History and Physical was                         performed, and patient medications, allergies and                         sensitivities were reviewed. The patient's tolerance                         of previous anesthesia was reviewed.                        - The risks and benefits of the procedure and the                         sedation options and risks were discussed with the                         patient. All questions were answered and informed                         consent was obtained.                        - ASA Grade Assessment: II - A patient with mild                         systemic disease.                        After obtaining informed consent, the colonoscope was                         passed under direct vision. Throughout the procedure,                         the patient's blood pressure, pulse, and oxygen                         saturations were monitored continuously. The                         Colonoscope was introduced through the anus and  advanced to the the cecum, identified by the                         appendiceal orifice. The colonoscopy was performed                         with ease. The patient tolerated the procedure well.                         The quality of the bowel preparation was excellent. Findings:      The  perianal and digital rectal examinations were normal.      Two sessile polyps were found in the ascending colon. The polyps were 4       to 5 mm in size. These polyps were removed with a cold biopsy forceps.       Resection and retrieval were complete.      Six sessile polyps were found in the descending colon. The polyps were 4       to 6 mm in size. These polyps were removed with a cold snare. Resection       and retrieval were complete.      A 10 mm polyp was found in the rectum. The polyp was sessile. The polyp       was removed with a cold snare. Resection and retrieval were complete.      The exam was otherwise without abnormality on direct and retroflexion       views. Impression:            - Two 4 to 5 mm polyps in the ascending colon, removed                         with a cold biopsy forceps. Resected and retrieved.                        - Six 4 to 6 mm polyps in the descending colon,                         removed with a cold snare. Resected and retrieved.                        - One 10 mm polyp in the rectum, removed with a cold                         snare. Resected and retrieved.                        - The examination was otherwise normal on direct and                         retroflexion views. Recommendation:        - Discharge patient to home (with escort).                        - Resume previous diet.                        - Continue present medications.                        -  Await pathology results.                        - Repeat colonoscopy for surveillance based on                         pathology results. Procedure Code(s):     --- Professional ---                        [phone], Colonoscopy, flexible; with removal of                         tumor(s), polyp(s), or other lesion(s) by snare                         technique                        [id number], 73, Colonoscopy, flexible; with biopsy, single                         or multiple Diagnosis Code(s):      --- Professional ---                        Z12.11, Encounter for screening for malignant neoplasm                         of colon                        K62.1, Rectal polyp                        K63.5, Polyp of colon CPT copyright 2019 American Medical Association. All rights reserved. The codes documented in this report are preliminary and upon coder review may  be revised to meet current compliance requirements. Jonathon Bellows, MD Jonathon Bellows MD, MD 07/13/2019 8:37:23 AM This report has been signed electronically. Number of Addenda: 0 Note Initiated On: 07/13/2019 7:31 AM Scope Withdrawal Time: 0 hours 16 minutes 4 seconds  Total Procedure Duration: 0 hours 22 minutes 5 seconds  Estimated Blood Loss:  Estimated blood loss: none.      [facility]

## 2019-07-13 NOTE — Anesthesia Postprocedure Evaluation (Addendum)
Anesthesia Post Note  Patient: Karla Hopkins  Procedure(s) Performed: COLONOSCOPY WITH PROPOFOL (N/A )  No complications documented.   Last Vitals:  Vitals:   07/13/19 0720 07/13/19 0844  BP: (!) 180/89 (!) 143/76  Pulse: 75   Resp: 18   Temp: (!) 35.9 C   SpO2: 100%     Last Pain:  Vitals:   07/13/19 0844  TempSrc:   PainSc: 0-No pain                 Anahis Furgeson

## 2019-07-13 NOTE — H&P (Signed)
Karla Bellows, MD [address] [place], [place], [place], [address] [address], [place], [place], [place], [address] Phone: [phone]  Fax: [phone]  Primary Care Physician:  [facility]   Pre-Procedure History & Physical: HPI:  Karla Hopkins is a 64 y.o. female is here for an colonoscopy.   Past Medical History:  Diagnosis Date  . Arthritis   . Diabetes mellitus without complication ([place])   . Glaucoma   . Hyperlipemia   . Hypertension     Past Surgical History:  Procedure Laterality Date  . ABDOMINAL HYSTERECTOMY    . COLONOSCOPY    . ESOPHAGOGASTRODUODENOSCOPY      Prior to Admission medications   Medication Sig Start Date End Date Taking? Authorizing Provider  aspirin 81 MG EC tablet Take by mouth. 07/16/10  Yes [provider]  BETIMOL 0.5 % ophthalmic solution Apply 1 drop to eye every morning. 02/02/19  Yes [provider]  Cholecalciferol 10 MCG (400 UNIT) CAPS Take by mouth.   Yes [provider]  cyanocobalamin 1000 MCG tablet Take by mouth.   Yes [provider]  doxazosin (CARDURA) 8 MG tablet Take 8 mg by mouth daily.   Yes [provider]  ferrous sulfate 325 (65 FE) MG tablet Take by mouth. 04/08/17  Yes [provider]  fexofenadine (ALLEGRA) 180 MG tablet daily 04/08/17  Yes [provider]  fluticasone (FLONASE) 50 MCG/ACT nasal spray Place into the nose. 04/21/18  Yes [provider]  glucose blood (ACCU-CHEK AVIVA PLUS) test strip USE TEST STRIP(S) TO CHECK GLUCOSE ONCE DAILY 04/11/19  Yes [provider]  hydrALAZINE (APRESOLINE) 50 MG tablet  03/29/19  Yes [provider]  hydrochlorothiazide (HYDRODIURIL) 25 MG tablet Take 25 mg by mouth daily.   Yes [provider]  insulin glargine (LANTUS SOLOSTAR) 100 UNIT/ML Solostar Pen INJECT 45 UNITS SUBCUTANEOUSLY NIGHTLY 05/09/17  Yes [provider]  lisinopril  (PRINIVIL,ZESTRIL) 40 MG tablet Take 40 mg by mouth daily.   Yes [provider]  metFORMIN (GLUCOPHAGE) 1000 MG tablet Take 1,000 mg by mouth 2 (two) times daily with a meal.   Yes [provider]  Multiple Vitamin (MULTIVITAMIN) capsule Take by mouth.   Yes [provider]  OneTouch Delica Lancets 28B MISC Use 1 each as directed.   Yes [provider]  pantoprazole (PROTONIX) 40 MG tablet TAKE 1 TABLET BY MOUTH ONCE DAILY 09/17/17  Yes [provider]  Pediatric Multivit-Minerals-C (FLINTSTONES GUMMIES) chewable tablet Chew by mouth.   Yes [provider]  pravastatin (PRAVACHOL) 20 MG tablet  04/27/19  Yes [provider]  verapamil (VERELAN) 100 MG 24 hr capsule Take 100 mg by mouth in the morning and at bedtime.   Yes [provider]    Allergies as of 06/24/2019  . (No Known Allergies)    Family History  Problem Relation Age of Onset  . Pancreatic cancer Mother   . Lung cancer Father   . Breast cancer Sister 42  . Breast cancer Sister 64  . Breast cancer Sister 99  . Lung cancer Brother     Social History   Socioeconomic History  . Marital status: Married    Spouse name: Not on file  . Number of children: Not on file  . Years of education: Not on file  . Highest education level: Not on file  Occupational History  . Not on file  Tobacco Use  .  Smoking status: Former Research scientist (life sciences)  . Smokeless tobacco: Never Used  Vaping Use  . Vaping Use: Never used  Substance and Sexual Activity  . Alcohol use: No  . Drug use: No  . Sexual activity: Not on file  Other Topics Concern  . Not on file  Social History Narrative  . Not on file   Social Determinants of Health   Financial Resource Strain:   . Difficulty of Paying Living Expenses:   Food Insecurity:   . Worried About Charity fundraiser in the Last Year:   . Arboriculturist in the Last Year:   Transportation Needs:   . Film/video editor (Medical):    Marland Kitchen Lack of Transportation (Non-Medical):   Physical Activity:   . Days of Exercise per Week:   . Minutes of Exercise per Session:   Stress:   . Feeling of Stress :   Social Connections:   . Frequency of Communication with Friends and Family:   . Frequency of Social Gatherings with Friends and Family:   . Attends Religious Services:   . Active Member of Clubs or Organizations:   . Attends Archivist Meetings:   Marland Kitchen Marital Status:   Intimate Partner Violence:   . Fear of Current or Ex-Partner:   . Emotionally Abused:   Marland Kitchen Physically Abused:   . Sexually Abused:     Review of Systems: See HPI, otherwise negative ROS  Physical Exam: BP (!) 180/89   Pulse 75   Temp (!) 96.6 F (35.9 C) (Temporal)   Resp 18   Ht 5\' 2"  (1.575 m)   Wt 88.9 kg   SpO2 100%   BMI 35.85 kg/m  General:   Alert,  pleasant and cooperative in NAD Head:  Normocephalic and atraumatic. Neck:  Supple; no masses or thyromegaly. Lungs:  Clear throughout to auscultation, normal respiratory effort.    Heart:  +S1, +S2, Regular rate and rhythm, No edema. Abdomen:  Soft, nontender and nondistended. Normal bowel sounds, without guarding, and without rebound.   Neurologic:  Alert and  oriented x4;  grossly normal neurologically.  Impression/Plan: Karla Hopkins is here for an colonoscopy to be performed for Screening colonoscopy average risk   Risks, benefits, limitations, and alternatives regarding  colonoscopy have been reviewed with the patient.  Questions have been answered.  All parties agreeable.   Karla Bellows, MD  07/13/2019, 8:06 AM

## 2019-07-13 NOTE — Anesthesia Preprocedure Evaluation (Signed)
Anesthesia Evaluation  Patient identified by MRN, date of birth, ID band Patient awake    Reviewed: Allergy & Precautions, NPO status , Patient's Chart, lab work & pertinent test results  Airway Mallampati: III       Dental   Pulmonary neg pulmonary ROS,           Cardiovascular hypertension,      Neuro/Psych negative neurological ROS  negative psych ROS   GI/Hepatic Neg liver ROS, GERD  Medicated,  Endo/Other  diabetes, Well Controlled, Insulin Dependent, Oral Hypoglycemic Agents  Renal/GU negative Renal ROS  negative genitourinary   Musculoskeletal  (+) Arthritis , Osteoarthritis,    Abdominal   Peds negative pediatric ROS (+)  Hematology negative hematology ROS (+)   Anesthesia Other Findings Past Medical History: No date: Arthritis No date: Diabetes mellitus without complication ([facility]) No date: Hypertension  Reproductive/Obstetrics negative OB ROS                             Anesthesia Physical Anesthesia Plan  ASA: II  Anesthesia Plan: General   Post-op Pain Management:    Induction: Intravenous  PONV Risk Score and Plan: Propofol infusion  Airway Management Planned: Nasal Cannula  Additional Equipment:   Intra-op Plan:   Post-operative Plan:   Informed Consent: I have reviewed the patients History and Physical, chart, labs and discussed the procedure including the risks, benefits and alternatives for the proposed anesthesia with the patient or authorized representative who has indicated his/her understanding and acceptance.     Dental advisory given  Plan Discussed with: CRNA and Surgeon  Anesthesia Plan Comments:         Anesthesia Quick Evaluation

## 2019-07-13 NOTE — Transfer of Care (Signed)
Immediate Anesthesia Transfer of Care Note  Patient: Bertram Gala  Procedure(s) Performed: COLONOSCOPY WITH PROPOFOL (N/A )  Patient Location: PACU and Endoscopy Unit  Anesthesia Type:General  Level of Consciousness: awake, alert  and oriented  Airway & Oxygen Therapy: Patient Spontanous Breathing  Post-op Assessment: Report given to RN  Post vital signs: Reviewed and stable  Last Vitals:  Vitals Value Taken Time  BP 143/76 07/13/19 0844  Temp    Pulse 70 07/13/19 0844  Resp 20 07/13/19 0844  SpO2 99 % 07/13/19 0844  Vitals shown include unvalidated device data.  Last Pain:  Vitals:   07/13/19 0844  TempSrc:   PainSc: 0-No pain         Complications: No complications documented.

## 2019-07-14 ENCOUNTER — Encounter: Payer: Self-pay | Admitting: Gastroenterology

## 2019-07-15 LAB — SURGICAL PATHOLOGY

## 2019-07-18 ENCOUNTER — Encounter: Payer: Self-pay | Admitting: Gastroenterology

## 2020-03-08 ENCOUNTER — Other Ambulatory Visit: Payer: Self-pay | Admitting: Family Medicine

## 2020-03-08 DIAGNOSIS — Z1231 Encounter for screening mammogram for malignant neoplasm of breast: Secondary | ICD-10-CM

## 2020-04-04 ENCOUNTER — Other Ambulatory Visit: Payer: Self-pay | Admitting: Family Medicine

## 2020-04-04 DIAGNOSIS — Z13820 Encounter for screening for osteoporosis: Secondary | ICD-10-CM

## 2020-05-09 ENCOUNTER — Ambulatory Visit
Admission: RE | Admit: 2020-05-09 | Discharge: 2020-05-09 | Disposition: A | Discharge status: Home / Self Care | Payer: Medicare HMO | Source: Ambulatory Visit | Attending: Family Medicine | Admitting: Family Medicine

## 2020-05-09 ENCOUNTER — Other Ambulatory Visit: Payer: Self-pay

## 2020-05-09 DIAGNOSIS — Z1231 Encounter for screening mammogram for malignant neoplasm of breast: Secondary | ICD-10-CM | POA: Diagnosis present

## 2020-05-09 DIAGNOSIS — E119 Type 2 diabetes mellitus without complications: Secondary | ICD-10-CM | POA: Insufficient documentation

## 2020-05-09 DIAGNOSIS — Z13820 Encounter for screening for osteoporosis: Secondary | ICD-10-CM

## 2020-05-09 DIAGNOSIS — Z780 Asymptomatic menopausal state: Secondary | ICD-10-CM | POA: Diagnosis not present

## 2020-05-10 ENCOUNTER — Inpatient Hospital Stay
Admission: RE | Admit: 2020-05-10 | Discharge: 2020-05-10 | Disposition: A | Discharge status: Home / Self Care | Payer: Self-pay | Source: Ambulatory Visit | Attending: *Deleted | Admitting: *Deleted

## 2020-05-10 ENCOUNTER — Other Ambulatory Visit: Payer: Self-pay | Admitting: *Deleted

## 2020-05-10 DIAGNOSIS — Z1231 Encounter for screening mammogram for malignant neoplasm of breast: Secondary | ICD-10-CM

## 2020-06-05 ENCOUNTER — Other Ambulatory Visit (INDEPENDENT_AMBULATORY_CARE_PROVIDER_SITE_OTHER): Payer: Self-pay | Admitting: Nurse Practitioner

## 2020-06-05 DIAGNOSIS — I701 Atherosclerosis of renal artery: Secondary | ICD-10-CM

## 2020-06-06 ENCOUNTER — Other Ambulatory Visit: Payer: Self-pay

## 2020-06-06 ENCOUNTER — Encounter (INDEPENDENT_AMBULATORY_CARE_PROVIDER_SITE_OTHER): Payer: Self-pay | Admitting: Vascular Surgery

## 2020-06-06 ENCOUNTER — Ambulatory Visit (INDEPENDENT_AMBULATORY_CARE_PROVIDER_SITE_OTHER): Payer: Medicare HMO | Admitting: Vascular Surgery

## 2020-06-06 ENCOUNTER — Ambulatory Visit (INDEPENDENT_AMBULATORY_CARE_PROVIDER_SITE_OTHER): Payer: Medicare HMO

## 2020-06-06 VITALS — BP 172/71 | HR 71 | Ht 62.0 in | Wt 190.0 lb

## 2020-06-06 DIAGNOSIS — I10 Essential (primary) hypertension: Secondary | ICD-10-CM

## 2020-06-06 DIAGNOSIS — I701 Atherosclerosis of renal artery: Secondary | ICD-10-CM | POA: Diagnosis not present

## 2020-06-06 DIAGNOSIS — E1165 Type 2 diabetes mellitus with hyperglycemia: Secondary | ICD-10-CM

## 2020-06-06 NOTE — Assessment & Plan Note (Signed)
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  No role for intervention at this degree of mild disease.  This is unlikely the cause of her severe hypertension.  Recheck in 1 year.

## 2020-06-06 NOTE — Assessment & Plan Note (Signed)
blood glucose control important in reducing the progression of atherosclerotic disease. Also, involved in wound healing. On appropriate medications.  

## 2020-06-06 NOTE — Assessment & Plan Note (Signed)
blood pressure control important in reducing the progression of atherosclerotic disease. On appropriate oral medications.  

## 2020-06-06 NOTE — Progress Notes (Signed)
MRN : [id number]  Karla Hopkins is a 65 y.o. ([birth year]) female who presents with chief complaint of  Chief Complaint  Patient presents with  . Follow-up    1 yr U/S   .  History of Present Illness: Patient returns today in follow up of renal artery disease.  She continues to have blood pressure that is elevated but not dramatically so.  Her blood pressures usually run anywhere from 150s to 170s.  Her renal function is okay as far she knows.  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  Current Outpatient Medications  Medication Sig Dispense Refill  . aspirin 81 MG EC tablet Take by mouth.    . BETIMOL 0.5 % ophthalmic solution Apply 1 drop to eye every morning.    . Cholecalciferol 10 MCG (400 UNIT) CAPS Take by mouth.    . cyanocobalamin 1000 MCG tablet Take by mouth.    . doxazosin (CARDURA) 8 MG tablet Take 8 mg by mouth daily.    . famotidine (PEPCID) 20 MG tablet Take 1 tablet by mouth daily.    . ferrous sulfate 325 (65 FE) MG tablet Take by mouth.    . fexofenadine (ALLEGRA) 180 MG tablet daily    . fluticasone (FLONASE) 50 MCG/ACT nasal spray Place into the nose.    Marland Kitchen glucose blood (ACCU-CHEK AVIVA PLUS) test strip USE TEST STRIP(S) TO CHECK GLUCOSE ONCE DAILY    . hydrALAZINE (APRESOLINE) 50 MG tablet     . hydrochlorothiazide (HYDRODIURIL) 25 MG tablet Take 25 mg by mouth daily.    . insulin glargine (LANTUS SOLOSTAR) 100 UNIT/ML Solostar Pen INJECT 45 UNITS SUBCUTANEOUSLY NIGHTLY    . lisinopril (PRINIVIL,ZESTRIL) 40 MG tablet Take 40 mg by mouth daily.    . metFORMIN (GLUCOPHAGE) 1000 MG tablet Take 1,000 mg by mouth 2 (two) times daily with a meal.    . Multiple Vitamin (MULTIVITAMIN) capsule Take by mouth.    Glory Rosebush Delica Lancets 26Z MISC Use 1 each as directed.    Marland Kitchen OZEMPIC, 0.25 OR 0.5 MG/DOSE, 2 MG/1.5ML SOPN Inject 0.25 mg subcutaneously once a week as directed for  four weeks then increase to 0.5 mg subcutaneously once per week    . rosuvastatin (CRESTOR) 20 MG tablet     . verapamil (VERELAN) 100 MG 24 hr capsule Take 100 mg by mouth in the morning and at bedtime.    . pantoprazole (PROTONIX) 40 MG tablet TAKE 1 TABLET BY MOUTH ONCE DAILY (Patient not taking: Reported on 06/06/2020)    . Pediatric Multivit-Minerals-C (FLINTSTONES GUMMIES) chewable tablet Chew by mouth. (Patient not taking: Reported on 06/06/2020)    . pravastatin (PRAVACHOL) 20 MG tablet  (Patient not taking: Reported on 06/06/2020)     No current facility-administered medications for this visit.    Past Medical History:  Diagnosis Date  . Arthritis   . Diabetes mellitus without complication ([place])   . Glaucoma   . Hyperlipemia   . Hypertension     Past Surgical History:  Procedure Laterality Date  . ABDOMINAL HYSTERECTOMY    . COLONOSCOPY    . COLONOSCOPY WITH PROPOFOL N/A 07/13/2019   Procedure: COLONOSCOPY WITH PROPOFOL;  Surgeon: Jonathon Bellows, MD;  Location: [facility] ENDOSCOPY;  Service: Gastroenterology;  Laterality: N/A;  . ESOPHAGOGASTRODUODENOSCOPY       Social History   Tobacco Use  . Smoking status: Former Research scientist (life sciences)  . Smokeless tobacco: Never  Used  Vaping Use  . Vaping Use: Never used  Substance Use Topics  . Alcohol use: No  . Drug use: No      Family History  Problem Relation Age of Onset  . Pancreatic cancer Mother   . Lung cancer Father   . Breast cancer Sister 107  . Breast cancer Sister 110  . Breast cancer Sister 18  . Lung cancer Brother   . Breast cancer Other      No Known Allergies   REVIEW OF SYSTEMS (Negative unless checked)  Constitutional: [] Weight loss  [] Fever  [] Chills Cardiac: [] Chest pain   [] Chest pressure   [] Palpitations   [] Shortness of breath when laying flat   [] Shortness of breath at rest   [] Shortness of breath with exertion. Vascular:  [] Pain in legs with walking   [] Pain in legs at rest   [] Pain in legs when laying flat    [] Claudication   [] Pain in feet when walking  [] Pain in feet at rest  [] Pain in feet when laying flat   [] History of DVT   [] Phlebitis   [] Swelling in legs   [] Varicose veins   [] Non-healing ulcers Pulmonary:   [] Uses home oxygen   [] Productive cough   [] Hemoptysis   [] Wheeze  [] COPD   [] Asthma Neurologic:  [] Dizziness  [] Blackouts   [] Seizures   [] History of stroke   [] History of TIA  [] Aphasia   [] Temporary blindness   [] Dysphagia   [] Weakness or numbness in arms   [] Weakness or numbness in legs Musculoskeletal:  [x] Arthritis   [] Joint swelling   [x] Joint pain   [] Low back pain Hematologic:  [] Easy bruising  [] Easy bleeding   [] Hypercoagulable state   [] Anemic   Gastrointestinal:  [] Blood in stool   [] Vomiting blood  [x] Gastroesophageal reflux/heartburn   [] Abdominal pain Genitourinary:  [] Chronic kidney disease   [] Difficult urination  [] Frequent urination  [] Burning with urination   [] Hematuria Skin:  [] Rashes   [] Ulcers   [] Wounds Psychological:  [] History of anxiety   []  History of major depression.  Physical Examination  BP (!) 172/71   Pulse 71   Ht 5\' 2"  (1.575 m)   Wt 190 lb (86.2 kg)   BMI 34.75 kg/m  Gen:  WD/WN, NAD Head: /AT, No temporalis wasting. Ear/Nose/Throat: Hearing grossly intact, nares w/o erythema or drainage Eyes: Conjunctiva clear. Sclera non-icteric Neck: Supple.  Trachea midline Pulmonary:  Good air movement, no use of accessory muscles.  Cardiac: RRR, no JVD Vascular:  Vessel Right Left  Radial Palpable Palpable                   Gastrointestinal: soft, non-tender/non-distended. No guarding/reflex.  Musculoskeletal: M/S 5/5 throughout.  No deformity or atrophy.  Trace lower extremity edema. Neurologic: Sensation grossly intact in extremities.  Symmetrical.  Speech is fluent.  Psychiatric: Judgment intact, Mood & affect appropriate for pt's clinical situation. Dermatologic: No rashes or ulcers noted.  No cellulitis or open  wounds.       Labs No results found for this or any previous visit (from the past 2160 hour(s)).  Radiology DG BONE DENSITY (DXA)  Result Date: 05/09/2020 EXAM: DUAL X-RAY ABSORPTIOMETRY (DXA) FOR BONE MINERAL DENSITY IMPRESSION: Your patient Karla Hopkins completed a BMD test on 05/09/2020 using the [place] (software version: 14.10) manufactured by [employer]. The following summarizes the results of our evaluation. Technologist: ecj PATIENT BIOGRAPHICAL: Name: Karla Hopkins, Karla Hopkins Patient ID: [id number] Birth Date: Sep 08, 1955 Height: 62.0 in.  Gender: Female Exam Date: 05/09/2020 Weight: 193.0 lbs. Indications: Diabetic, High Risk Meds, Hysterectomy, Postmenopausal Fractures: Treatments: Allegra, Flonase, Metformin, Protonix, Vitamin D DENSITOMETRY RESULTS: Site      Region     Measured Date Measured Age WHO Classification Young Adult T-score BMD         %Change vs. Previous Significant Change (*) AP Spine L1-L2 3/09/2020 66.9 Normal 3.5 1.605 g/cm2 - - DualFemur Neck Right 05[age]09/2020 66.9 Normal 0.9 1.167 g/cm2 - - ASSESSMENT: The BMD measured at Femur Neck Right is 1.167 g/cm2 with a T-score of 0.9. This patient is considered normal according to [facility]) criteria. The scan quality is good. L 3 and 4 was was excluded due to degeneragtive changes. World Pharmacologist [facility]) criteria for post-menopausal, Caucasian Women: Normal:                   T-score at or above -1 SD Osteopenia/low bone mass: T-score between -1 and -2.5 SD Osteoporosis:             T-score at or below -2.5 SD RECOMMENDATIONS: 1. All patients should optimize calcium and vitamin D intake. 2. Consider FDA-approved medical therapies in postmenopausal women and men aged 13 years and older, based on the following: a. A hip or vertebral(clinical or morphometric) fracture b. T-score < -2.5 at the femoral neck or spine after appropriate evaluation to exclude secondary causes c. Low bone mass  (T-score between -1.0 and -2.5 at the femoral neck or spine) and a 10-year probability of a hip fracture > 3% or a 10-year probability of a major osteoporosis-related fracture > 20% based on the [place]-adapted WHO algorithm 3. Clinician judgment and/or patient preferences may indicate treatment for people with 10-year fracture probabilities above or below these levels FOLLOW-UP: People with diagnosed cases of osteoporosis or at high risk for fracture should have regular bone mineral density tests. For patients eligible for Medicare, routine testing is allowed once every 2 years. The testing frequency can be increased to one year for patients who have rapidly progressing disease, those who are receiving or discontinuing medical therapy to restore bone mass, or have additional risk factors. I have reviewed this report, and agree with the above findings. [facility] Electronically Signed   By: Rolm Baptise M.D.   On: 05/09/2020 16:20   MM 3D SCREEN BREAST BILATERAL  Result Date: 05/10/2020 CLINICAL DATA:  Screening. EXAM: DIGITAL SCREENING BILATERAL MAMMOGRAM WITH TOMOSYNTHESIS AND CAD TECHNIQUE: Bilateral screening digital craniocaudal and mediolateral oblique mammograms were obtained. Bilateral screening digital breast tomosynthesis was performed. The images were evaluated with computer-aided detection. COMPARISON:  Previous exam(s). ACR Breast Density Category b: There are scattered areas of fibroglandular density. FINDINGS: There are no findings suspicious for malignancy. The images were evaluated with computer-aided detection. IMPRESSION: No mammographic evidence of malignancy. A result letter of this screening mammogram will be mailed directly to the patient. RECOMMENDATION: 1.  Screening mammogram in one year. (Code:SM-B-01Y) 2. Breast cancer risk assessment given family history. If patient is at elevated lifetime risk (greater than 20%), annual screening breast MRI is recommended in addition to  annual screening mammography. BI-RADS CATEGORY  1: Negative. Electronically Signed   By: Audie Pinto M.D.   On: 05/10/2020 11:31   MM Outside Films Mammo  Result Date: 05/16/2020 This examination belongs to an outside facility and is stored here for comparison purposes only.  Contact the originating outside institution for any associated report or interpretation.  MM Outside Films  Mammo  Result Date: 05/16/2020 This examination belongs to an outside facility and is stored here for comparison purposes only.  Contact the originating outside institution for any associated report or interpretation.  MM Outside Films Mammo  Result Date: 05/16/2020 This examination belongs to an outside facility and is stored here for comparison purposes only.  Contact the originating outside institution for any associated report or interpretation.   Assessment/Plan  Renal artery stenosis (HCC) Duplex today showed no hemodynamically significant renal artery stenosis with some known atherosclerotic disease of the renal arteries.  No role for intervention at this degree of mild disease.  This is unlikely the cause of her severe hypertension.  Recheck in 1 year.  Essential hypertension blood pressure control important in reducing the progression of atherosclerotic disease. On appropriate oral medications.   Type 2 diabetes mellitus with hyperglycemia (HCC) blood glucose control important in reducing the progression of atherosclerotic disease. Also, involved in wound healing. On appropriate medications.     Leotis Pain, MD  06/06/2020 1:23 PM    This note was created with Dragon medical transcription system.  Any errors from dictation are purely unintentional

## 2021-04-05 ENCOUNTER — Other Ambulatory Visit: Payer: Self-pay | Admitting: Family Medicine

## 2021-04-05 DIAGNOSIS — Z1231 Encounter for screening mammogram for malignant neoplasm of breast: Secondary | ICD-10-CM

## 2021-06-05 ENCOUNTER — Ambulatory Visit (INDEPENDENT_AMBULATORY_CARE_PROVIDER_SITE_OTHER): Payer: Medicare HMO | Admitting: Vascular Surgery

## 2021-06-05 ENCOUNTER — Encounter (INDEPENDENT_AMBULATORY_CARE_PROVIDER_SITE_OTHER): Payer: Medicare HMO

## 2021-06-14 ENCOUNTER — Ambulatory Visit
Admission: RE | Admit: 2021-06-14 | Discharge: 2021-06-14 | Disposition: A | Discharge status: Home / Self Care | Payer: Medicare HMO | Source: Ambulatory Visit | Attending: Family Medicine | Admitting: Family Medicine

## 2021-06-14 DIAGNOSIS — Z1231 Encounter for screening mammogram for malignant neoplasm of breast: Secondary | ICD-10-CM | POA: Diagnosis present

## 2021-07-26 ENCOUNTER — Other Ambulatory Visit (INDEPENDENT_AMBULATORY_CARE_PROVIDER_SITE_OTHER): Payer: Self-pay | Admitting: Vascular Surgery

## 2021-07-26 DIAGNOSIS — I701 Atherosclerosis of renal artery: Secondary | ICD-10-CM

## 2021-07-27 ENCOUNTER — Ambulatory Visit (INDEPENDENT_AMBULATORY_CARE_PROVIDER_SITE_OTHER): Payer: Medicare HMO | Admitting: Vascular Surgery

## 2021-07-27 ENCOUNTER — Ambulatory Visit (INDEPENDENT_AMBULATORY_CARE_PROVIDER_SITE_OTHER): Payer: Medicare HMO

## 2021-07-27 DIAGNOSIS — I701 Atherosclerosis of renal artery: Secondary | ICD-10-CM

## 2021-08-10 ENCOUNTER — Encounter (INDEPENDENT_AMBULATORY_CARE_PROVIDER_SITE_OTHER): Payer: Self-pay | Admitting: *Deleted

## 2022-05-09 ENCOUNTER — Other Ambulatory Visit: Payer: Self-pay | Admitting: Family Medicine

## 2022-05-09 DIAGNOSIS — Z1231 Encounter for screening mammogram for malignant neoplasm of breast: Secondary | ICD-10-CM

## 2022-05-20 ENCOUNTER — Telehealth: Payer: Self-pay

## 2022-05-20 NOTE — Telephone Encounter (Signed)
LVM for pt to return my call.   Last colonoscopy was with Dr. Tobi Bastos 07/13/19.  She will be due 07/13/22.  Thanks,  [place], [place]

## 2022-05-20 NOTE — Telephone Encounter (Signed)
Pt returning call to schedule screening colonoscopy. Referral does also states constipation, but the pt says this is no longer an issue. She just needs to schedule procedure.

## 2022-05-22 ENCOUNTER — Telehealth: Payer: Self-pay | Admitting: *Deleted

## 2022-05-22 ENCOUNTER — Other Ambulatory Visit: Payer: Self-pay | Admitting: *Deleted

## 2022-05-22 DIAGNOSIS — Z86010 Personal history of colonic polyps: Secondary | ICD-10-CM

## 2022-05-22 MED ORDER — NA SULFATE-K SULFATE-MG SULF 17.5-3.13-1.6 GM/177ML PO SOLN: 1.0000 | Freq: Once | ORAL | 0 refills | Status: AC

## 2022-05-22 NOTE — Telephone Encounter (Deleted)
Gastroenterology Pre-Procedure Review  Request Date: 08/05/2022 Requesting Physician: Dr. Tobi Bastos  PATIENT REVIEW QUESTIONS: The patient responded to the following health history questions as indicated:    1. Are you having any GI issues? yes (constipation but it is better) 2. Do you have a personal history of Polyps? yes (07/13/2019) 3. Do you have a family history of Colon Cancer or Polyps? no 4. Diabetes Mellitus? yes (taking metformin, Ozempic) 5. Joint replacements in the past 12 months?no 6. Major health problems in the past 3 months?no 7. Any artificial heart valves, MVP, or defibrillator?no    MEDICATIONS & ALLERGIES:    Patient reports the following regarding taking any anticoagulation/antiplatelet therapy:   Plavix, Coumadin, Eliquis, Xarelto, Lovenox, Pradaxa, Brilinta, or Effient? no Aspirin? yes (81 mg)  Patient confirms/reports the following medications:  Current Outpatient Medications  Medication Sig Dispense Refill   Na Sulfate-K Sulfate-Mg Sulf 17.5-3.13-1.6 GM/177ML SOLN Take 1 kit by mouth once for 1 dose. 354 mL 0   aspirin 81 MG EC tablet Take by mouth.     BETIMOL 0.5 % ophthalmic solution Apply 1 drop to eye every morning.     Cholecalciferol 10 MCG (400 UNIT) CAPS Take by mouth.     cyanocobalamin 1000 MCG tablet Take by mouth.     doxazosin (CARDURA) 8 MG tablet Take 8 mg by mouth daily.     famotidine (PEPCID) 20 MG tablet Take 1 tablet by mouth daily.     ferrous sulfate 325 (65 FE) MG tablet Take by mouth.     fexofenadine (ALLEGRA) 180 MG tablet daily     fluticasone (FLONASE) 50 MCG/ACT nasal spray Place into the nose.     glucose blood (ACCU-CHEK AVIVA PLUS) test strip USE TEST STRIP(S) TO CHECK GLUCOSE ONCE DAILY     hydrALAZINE (APRESOLINE) 50 MG tablet      hydrochlorothiazide (HYDRODIURIL) 25 MG tablet Take 25 mg by mouth daily.     insulin glargine (LANTUS SOLOSTAR) 100 UNIT/ML Solostar Pen INJECT 45 UNITS SUBCUTANEOUSLY NIGHTLY     lisinopril  (PRINIVIL,ZESTRIL) 40 MG tablet Take 40 mg by mouth daily.     metFORMIN (GLUCOPHAGE) 1000 MG tablet Take 1,000 mg by mouth 2 (two) times daily with a meal.     Multiple Vitamin (MULTIVITAMIN) capsule Take by mouth.     OneTouch Delica Lancets 33G MISC Use 1 each as directed.     OZEMPIC, 0.25 OR 0.5 MG/DOSE, 2 MG/1.5ML SOPN Inject 0.25 mg subcutaneously once a week as directed for four weeks then increase to 0.5 mg subcutaneously once per week     pantoprazole (PROTONIX) 40 MG tablet TAKE 1 TABLET BY MOUTH ONCE DAILY (Patient not taking: Reported on 06/06/2020)     Pediatric Multivit-Minerals-C (FLINTSTONES GUMMIES) chewable tablet Chew by mouth. (Patient not taking: Reported on 06/06/2020)     pravastatin (PRAVACHOL) 20 MG tablet  (Patient not taking: Reported on 06/06/2020)     rosuvastatin (CRESTOR) 20 MG tablet      verapamil (VERELAN) 100 MG 24 hr capsule Take 100 mg by mouth in the morning and at bedtime.     No current facility-administered medications for this visit.    Patient confirms/reports the following allergies:  No Known Allergies  No orders of the defined types were placed in this encounter.   AUTHORIZATION INFORMATION Primary Insurance: 1D#: Group #:  Secondary Insurance: 1D#: Group #:  SCHEDULE INFORMATION: Date: 08/05/2022 Time: Location:  [facility]

## 2022-05-22 NOTE — Telephone Encounter (Signed)
Gastroenterology Pre-Procedure Review  Request Date: 08/05/2022 Requesting Physician: Dr. Tobi Bastos  PATIENT REVIEW QUESTIONS: The patient responded to the following health history questions as indicated:    1. Are you having any GI issues? yes (constipation) 2. Do you have a personal history of Polyps? yes (07/13/2019) 3. Do you have a family history of Colon Cancer or Polyps? no 4. Diabetes Mellitus? yes (metformin, Victoza) 5. Joint replacements in the past 12 months?no 6. Major health problems in the past 3 months?no 7. Any artificial heart valves, MVP, or defibrillator?no    MEDICATIONS & ALLERGIES:    Patient reports the following regarding taking any anticoagulation/antiplatelet therapy:   Plavix, Coumadin, Eliquis, Xarelto, Lovenox, Pradaxa, Brilinta, or Effient? no Aspirin? yes (81 mg)  Patient confirms/reports the following medications:  Current Outpatient Medications  Medication Sig Dispense Refill   brimonidine (ALPHAGAN) 0.2 % ophthalmic solution 1 drop 2 (two) times daily.     Na Sulfate-K Sulfate-Mg Sulf 17.5-3.13-1.6 GM/177ML SOLN Take 1 kit by mouth once for 1 dose. 354 mL 0   aspirin 81 MG EC tablet Take by mouth.     BETIMOL 0.5 % ophthalmic solution Apply 1 drop to eye every morning.     Cholecalciferol 10 MCG (400 UNIT) CAPS Take by mouth.     cyanocobalamin 1000 MCG tablet Take by mouth.     doxazosin (CARDURA) 8 MG tablet Take 8 mg by mouth daily.     famotidine (PEPCID) 20 MG tablet Take 1 tablet by mouth daily.     ferrous sulfate 325 (65 FE) MG tablet Take by mouth.     fexofenadine (ALLEGRA) 180 MG tablet daily     fluticasone (FLONASE) 50 MCG/ACT nasal spray Place into the nose.     glucose blood (ACCU-CHEK AVIVA PLUS) test strip USE TEST STRIP(S) TO CHECK GLUCOSE ONCE DAILY     hydrALAZINE (APRESOLINE) 50 MG tablet      hydrochlorothiazide (HYDRODIURIL) 25 MG tablet Take 25 mg by mouth daily.     insulin glargine (LANTUS SOLOSTAR) 100 UNIT/ML Solostar Pen  INJECT 45 UNITS SUBCUTANEOUSLY NIGHTLY     latanoprost (XALATAN) 0.005 % ophthalmic solution Apply to eye.     lisinopril (PRINIVIL,ZESTRIL) 40 MG tablet Take 40 mg by mouth daily.     metFORMIN (GLUCOPHAGE) 1000 MG tablet Take 1,000 mg by mouth 2 (two) times daily with a meal.     Multiple Vitamin (MULTIVITAMIN) capsule Take by mouth.     OneTouch Delica Lancets 33G MISC Use 1 each as directed.     pantoprazole (PROTONIX) 40 MG tablet TAKE 1 TABLET BY MOUTH ONCE DAILY (Patient not taking: Reported on 06/06/2020)     Pediatric Multivit-Minerals-C (FLINTSTONES GUMMIES) chewable tablet Chew by mouth. (Patient not taking: Reported on 06/06/2020)     polyethylene glycol (MIRALAX / GLYCOLAX) 17 g packet Take by mouth.     pravastatin (PRAVACHOL) 20 MG tablet  (Patient not taking: Reported on 06/06/2020)     rosuvastatin (CRESTOR) 20 MG tablet      verapamil (VERELAN) 100 MG 24 hr capsule Take 100 mg by mouth in the morning and at bedtime.     VICTOZA 18 MG/3ML SOPN Inject into the skin.     No current facility-administered medications for this visit.    Patient confirms/reports the following allergies:  No Known Allergies  No orders of the defined types were placed in this encounter.   AUTHORIZATION INFORMATION Primary Insurance: 1D#: Group #:  Secondary Insurance: 1D#: Group #:  SCHEDULE INFORMATION: Date: 08/05/2022 Time: Location:  [facility]

## 2022-05-22 NOTE — Telephone Encounter (Signed)
Colonoscopy schedule on 08/05/2022 with Dr Tobi Bastos at [facility]

## 2022-06-17 ENCOUNTER — Ambulatory Visit
Admission: RE | Admit: 2022-06-17 | Discharge: 2022-06-17 | Disposition: A | Discharge status: Home / Self Care | Payer: Medicare HMO | Source: Ambulatory Visit | Attending: Family Medicine | Admitting: Family Medicine

## 2022-06-17 DIAGNOSIS — Z1231 Encounter for screening mammogram for malignant neoplasm of breast: Secondary | ICD-10-CM | POA: Insufficient documentation

## 2022-06-28 ENCOUNTER — Other Ambulatory Visit: Payer: Self-pay | Admitting: Family Medicine

## 2022-06-28 DIAGNOSIS — N63 Unspecified lump in unspecified breast: Secondary | ICD-10-CM

## 2022-06-28 DIAGNOSIS — R928 Other abnormal and inconclusive findings on diagnostic imaging of breast: Secondary | ICD-10-CM

## 2022-07-02 ENCOUNTER — Ambulatory Visit
Admission: RE | Admit: 2022-07-02 | Discharge: 2022-07-02 | Disposition: A | Discharge status: Home / Self Care | Payer: Medicare HMO | Source: Ambulatory Visit | Attending: Family Medicine | Admitting: Family Medicine

## 2022-07-02 DIAGNOSIS — R928 Other abnormal and inconclusive findings on diagnostic imaging of breast: Secondary | ICD-10-CM | POA: Diagnosis present

## 2022-07-02 DIAGNOSIS — N63 Unspecified lump in unspecified breast: Secondary | ICD-10-CM

## 2022-07-19 ENCOUNTER — Encounter (INDEPENDENT_AMBULATORY_CARE_PROVIDER_SITE_OTHER): Payer: Medicare HMO

## 2022-07-19 ENCOUNTER — Ambulatory Visit (INDEPENDENT_AMBULATORY_CARE_PROVIDER_SITE_OTHER): Payer: Medicare HMO | Admitting: Vascular Surgery

## 2022-07-22 ENCOUNTER — Other Ambulatory Visit (INDEPENDENT_AMBULATORY_CARE_PROVIDER_SITE_OTHER): Payer: Self-pay | Admitting: Nurse Practitioner

## 2022-07-22 DIAGNOSIS — I701 Atherosclerosis of renal artery: Secondary | ICD-10-CM

## 2022-07-26 ENCOUNTER — Ambulatory Visit (INDEPENDENT_AMBULATORY_CARE_PROVIDER_SITE_OTHER): Payer: Medicare HMO | Admitting: Vascular Surgery

## 2022-07-26 ENCOUNTER — Encounter (INDEPENDENT_AMBULATORY_CARE_PROVIDER_SITE_OTHER): Payer: Self-pay

## 2022-07-26 ENCOUNTER — Encounter (INDEPENDENT_AMBULATORY_CARE_PROVIDER_SITE_OTHER): Payer: Medicare HMO

## 2022-07-31 ENCOUNTER — Encounter: Payer: Self-pay | Admitting: Gastroenterology

## 2022-08-05 ENCOUNTER — Other Ambulatory Visit: Payer: Self-pay

## 2022-08-05 ENCOUNTER — Ambulatory Visit: Payer: Medicare HMO | Admitting: Anesthesiology

## 2022-08-05 ENCOUNTER — Encounter
Admission: RE | Disposition: A | Discharge status: Home / Self Care | Payer: Self-pay | Source: Home / Self Care | Attending: Gastroenterology

## 2022-08-05 ENCOUNTER — Ambulatory Visit
Admission: RE | Admit: 2022-08-05 | Discharge: 2022-08-05 | Disposition: A | Discharge status: Home / Self Care | Payer: Medicare HMO | Attending: Gastroenterology | Admitting: Gastroenterology

## 2022-08-05 ENCOUNTER — Encounter: Payer: Self-pay | Admitting: Gastroenterology

## 2022-08-05 DIAGNOSIS — E785 Hyperlipidemia, unspecified: Secondary | ICD-10-CM | POA: Diagnosis not present

## 2022-08-05 DIAGNOSIS — H409 Unspecified glaucoma: Secondary | ICD-10-CM | POA: Diagnosis not present

## 2022-08-05 DIAGNOSIS — D126 Benign neoplasm of colon, unspecified: Secondary | ICD-10-CM | POA: Diagnosis not present

## 2022-08-05 DIAGNOSIS — D122 Benign neoplasm of ascending colon: Secondary | ICD-10-CM | POA: Insufficient documentation

## 2022-08-05 DIAGNOSIS — E1151 Type 2 diabetes mellitus with diabetic peripheral angiopathy without gangrene: Secondary | ICD-10-CM | POA: Diagnosis not present

## 2022-08-05 DIAGNOSIS — Z79899 Other long term (current) drug therapy: Secondary | ICD-10-CM | POA: Diagnosis not present

## 2022-08-05 DIAGNOSIS — Z7984 Long term (current) use of oral hypoglycemic drugs: Secondary | ICD-10-CM | POA: Diagnosis not present

## 2022-08-05 DIAGNOSIS — Z794 Long term (current) use of insulin: Secondary | ICD-10-CM | POA: Insufficient documentation

## 2022-08-05 DIAGNOSIS — K219 Gastro-esophageal reflux disease without esophagitis: Secondary | ICD-10-CM | POA: Insufficient documentation

## 2022-08-05 DIAGNOSIS — Z1211 Encounter for screening for malignant neoplasm of colon: Secondary | ICD-10-CM

## 2022-08-05 DIAGNOSIS — Z09 Encounter for follow-up examination after completed treatment for conditions other than malignant neoplasm: Secondary | ICD-10-CM | POA: Diagnosis present

## 2022-08-05 DIAGNOSIS — Z860100 Personal history of colon polyps, unspecified: Secondary | ICD-10-CM

## 2022-08-05 DIAGNOSIS — Z87891 Personal history of nicotine dependence: Secondary | ICD-10-CM | POA: Insufficient documentation

## 2022-08-05 DIAGNOSIS — D124 Benign neoplasm of descending colon: Secondary | ICD-10-CM | POA: Insufficient documentation

## 2022-08-05 DIAGNOSIS — Z7985 Long-term (current) use of injectable non-insulin antidiabetic drugs: Secondary | ICD-10-CM | POA: Insufficient documentation

## 2022-08-05 DIAGNOSIS — I10 Essential (primary) hypertension: Secondary | ICD-10-CM | POA: Insufficient documentation

## 2022-08-05 HISTORY — PX: POLYPECTOMY: SHX5525

## 2022-08-05 HISTORY — PX: COLONOSCOPY WITH PROPOFOL: SHX5780

## 2022-08-05 LAB — GLUCOSE, CAPILLARY: Glucose-Capillary: 156 mg/dL — ABNORMAL HIGH (ref 70–99)

## 2022-08-05 SURGERY — COLONOSCOPY WITH PROPOFOL
Anesthesia: General

## 2022-08-05 MED ORDER — LABETALOL HCL 5 MG/ML IV SOLN
INTRAVENOUS | Status: AC
  Filled 2022-08-05: qty 4

## 2022-08-05 MED ORDER — PROPOFOL 10 MG/ML IV BOLUS
INTRAVENOUS | Status: AC
  Filled 2022-08-05: qty 20

## 2022-08-05 MED ORDER — FENTANYL CITRATE (PF) 100 MCG/2ML IJ SOLN: INTRAMUSCULAR | Status: DC | PRN

## 2022-08-05 MED ORDER — LIDOCAINE HCL (CARDIAC) PF 100 MG/5ML IV SOSY
PREFILLED_SYRINGE | INTRAVENOUS | Status: DC | PRN
  Administered 2022-08-05: 50 mg via INTRAVENOUS

## 2022-08-05 MED ORDER — LIDOCAINE HCL (PF) 2 % IJ SOLN
INTRAMUSCULAR | Status: AC
  Filled 2022-08-05: qty 5

## 2022-08-05 MED ORDER — SODIUM CHLORIDE 0.9 % IV SOLN: INTRAVENOUS | Status: DC

## 2022-08-05 MED ORDER — LABETALOL HCL 5 MG/ML IV SOLN
INTRAVENOUS | Status: DC | PRN
  Administered 2022-08-05: 5 mg via INTRAVENOUS

## 2022-08-05 MED ORDER — PROPOFOL 500 MG/50ML IV EMUL
INTRAVENOUS | Status: DC | PRN
  Administered 2022-08-05: 100 ug/kg/min via INTRAVENOUS

## 2022-08-05 MED ORDER — PROPOFOL 10 MG/ML IV BOLUS
INTRAVENOUS | Status: DC | PRN
  Administered 2022-08-05: 40 mg via INTRAVENOUS
  Administered 2022-08-05: 80 mg via INTRAVENOUS

## 2022-08-05 NOTE — Transfer of Care (Signed)
Immediate Anesthesia Transfer of Care Note  Patient: Karla Hopkins  Procedure(s) Performed: COLONOSCOPY WITH PROPOFOL POLYPECTOMY  Patient Location: PACU  Anesthesia Type:General  Level of Consciousness: awake, oriented, and patient cooperative  Airway & Oxygen Therapy: Patient Spontanous Breathing  Post-op Assessment: Report given to RN and Post -op Vital signs reviewed and stable  Post vital signs: Reviewed and stable  Last Vitals:  Vitals Value Taken Time  BP 146/68 08/05/22 1032  Temp    Pulse 70 08/05/22 1032  Resp 18 08/05/22 1032  SpO2 100 % 08/05/22 1032    Last Pain:  Vitals:   08/05/22 1032  TempSrc:   PainSc: 0-No pain         Complications: No notable events documented.

## 2022-08-05 NOTE — Anesthesia Preprocedure Evaluation (Signed)
Anesthesia Evaluation  Patient identified by MRN, date of birth, ID band Patient awake    Reviewed: Allergy & Precautions, H&P , NPO status , Patient's Chart, lab work & pertinent test results, reviewed documented beta blocker date and time   Airway Mallampati: III   Neck ROM: full    Dental  (+) Poor Dentition   Pulmonary neg pulmonary ROS, former smoker   Pulmonary exam normal        Cardiovascular Exercise Tolerance: Poor hypertension, On Medications + Peripheral Vascular Disease  Normal cardiovascular exam Rhythm:regular Rate:Normal     Neuro/Psych negative neurological ROS  negative psych ROS   GI/Hepatic Neg liver ROS,GERD  Medicated,,  Endo/Other  negative endocrine ROSdiabetes    Renal/GU negative Renal ROS  negative genitourinary   Musculoskeletal   Abdominal   Peds  Hematology negative hematology ROS (+)   Anesthesia Other Findings Past Medical History: No date: Arthritis No date: Diabetes mellitus without complication (HCC) No date: Glaucoma No date: Hyperlipemia No date: Hypertension Past Surgical History: No date: ABDOMINAL HYSTERECTOMY No date: COLONOSCOPY 07/13/2019: COLONOSCOPY WITH PROPOFOL; N/A     Comment:  Procedure: COLONOSCOPY WITH PROPOFOL;  Surgeon: Wyline Mood, MD;  Location: [facility] ENDOSCOPY;  Service:               Gastroenterology;  Laterality: N/A; No date: ESOPHAGOGASTRODUODENOSCOPY   Reproductive/Obstetrics negative OB ROS                             Anesthesia Physical Anesthesia Plan  ASA: 3  Anesthesia Plan: General   Post-op Pain Management:    Induction:   PONV Risk Score and Plan:   Airway Management Planned:   Additional Equipment:   Intra-op Plan:   Post-operative Plan:   Informed Consent: I have reviewed the patients History and Physical, chart, labs and discussed the procedure including the risks, benefits and  alternatives for the proposed anesthesia with the patient or authorized representative who has indicated his/her understanding and acceptance.     Dental Advisory Given  Plan Discussed with: CRNA  Anesthesia Plan Comments:        Anesthesia Quick Evaluation

## 2022-08-05 NOTE — Op Note (Signed)
[facility] Gastroenterology Patient Name: Karla Hopkins Procedure Date: 08/05/2022 9:56 AM MRN: [id number] Account #: [id number] Date of Birth: 02/09/1955 Admit Type: Outpatient Age: 67 Room: [facility] ENDO ROOM 1 Gender: Female Note Status: Finalized Instrument Name: Prentice Docker [id number] Procedure:             Colonoscopy Indications:           High risk colon cancer surveillance: Personal history                         of multiple (3 or more) adenomas Providers:             Wyline Mood MD, MD Referring MD:          Wyline Mood MD, MD (Referring MD), [facility], MD (Referring MD) Medicines:             Monitored Anesthesia Care Complications:         No immediate complications. Procedure:             Pre-Anesthesia Assessment:                        - Prior to the procedure, a History and Physical was                         performed, and patient medications, allergies and                         sensitivities were reviewed. The patient's tolerance                         of previous anesthesia was reviewed.                        - The risks and benefits of the procedure and the                         sedation options and risks were discussed with the                         patient. All questions were answered and informed                         consent was obtained.                        - ASA Grade Assessment: II - A patient with mild                         systemic disease.                        After obtaining informed consent, the colonoscope was                         passed under direct vision. Throughout the procedure,  the patient's blood pressure, pulse, and oxygen                         saturations were monitored continuously. The                         Colonoscope was introduced through the anus and                         advanced to the the cecum, identified by the                          appendiceal orifice. The colonoscopy was performed                         with ease. The patient tolerated the procedure well.                         The quality of the bowel preparation was excellent.                         The ileocecal valve, appendiceal orifice, and rectum                         were photographed. Findings:      The perianal and digital rectal examinations were normal.      Two sessile polyps were found in the descending colon and ascending       colon. The polyps were 5 to 6 mm in size. These polyps were removed with       a cold snare. Resection and retrieval were complete.      The exam was otherwise without abnormality on direct and retroflexion       views. Impression:            - Two 5 to 6 mm polyps in the descending colon and in                         the ascending colon, removed with a cold snare.                         Resected and retrieved.                        - The examination was otherwise normal on direct and                         retroflexion views. Recommendation:        - Discharge patient to home (with escort).                        - Resume previous diet.                        - Continue present medications.                        - Await pathology results.                        -  Repeat colonoscopy in 5 years for surveillance based                         on pathology results. Procedure Code(s):     --- Professional ---                        [phone], Colonoscopy, flexible; with removal of                         tumor(s), polyp(s), or other lesion(s) by snare                         technique Diagnosis Code(s):     --- Professional ---                        Z86.010, Personal history of colonic polyps                        D12.4, Benign neoplasm of descending colon                        D12.2, Benign neoplasm of ascending colon CPT copyright 2022 American Medical Association. All rights reserved. The codes  documented in this report are preliminary and upon coder review may  be revised to meet current compliance requirements. Wyline Mood, MD Wyline Mood MD, MD 08/05/2022 10:32:05 AM This report has been signed electronically. Number of Addenda: 0 Note Initiated On: 08/05/2022 9:56 AM Scope Withdrawal Time: 0 hours 7 minutes 26 seconds  Total Procedure Duration: 0 hours 16 minutes 48 seconds  Estimated Blood Loss:  Estimated blood loss: none.      [facility]

## 2022-08-05 NOTE — H&P (Signed)
Wyline Mood, MD [address], [address], [place], [place], [address] [address], [address], [place], [place], [address] Phone: [phone]  Fax: [phone]  Primary Care Physician:  [facility]   Pre-Procedure History & Physical: HPI:  Karla Hopkins is a 67 y.o. female is here for an colonoscopy.   Past Medical History:  Diagnosis Date   Arthritis    Diabetes mellitus without complication (HCC)    Glaucoma    Hyperlipemia    Hypertension     Past Surgical History:  Procedure Laterality Date   ABDOMINAL HYSTERECTOMY     COLONOSCOPY     COLONOSCOPY WITH PROPOFOL N/A 07/13/2019   Procedure: COLONOSCOPY WITH PROPOFOL;  Surgeon: Wyline Mood, MD;  Location: [facility] ENDOSCOPY;  Service: Gastroenterology;  Laterality: N/A;   ESOPHAGOGASTRODUODENOSCOPY      Prior to Admission medications   Medication Sig Start Date End Date Taking? Authorizing Provider  famotidine (PEPCID) 20 MG tablet Take 1 tablet by mouth daily. 09/13/19  Yes [provider]  hydrALAZINE (APRESOLINE) 50 MG tablet  03/29/19  Yes [provider]  hydrochlorothiazide (HYDRODIURIL) 25 MG tablet Take 25 mg by mouth daily.   Yes [provider]  metFORMIN (GLUCOPHAGE) 1000 MG tablet Take 1,000 mg by mouth 2 (two) times daily with a meal.   Yes [provider]  rosuvastatin (CRESTOR) 20 MG tablet  07/06/19  Yes [provider]  verapamil (VERELAN) 100 MG 24 hr capsule Take 100 mg by mouth in the morning and at bedtime.   Yes [provider]  aspirin 81 MG EC tablet Take by mouth. 07/16/10   [provider]  BETIMOL 0.5 % ophthalmic solution Apply 1 drop to eye every morning. 02/02/19   [provider]  brimonidine (ALPHAGAN) 0.2 % ophthalmic solution 1 drop 2 (two) times daily. 04/18/22   [provider]  Cholecalciferol 10 MCG (400 UNIT) CAPS Take by mouth.    [provider]  cyanocobalamin 1000 MCG tablet Take by  mouth.    [provider]  doxazosin (CARDURA) 8 MG tablet Take 8 mg by mouth daily.    [provider]  ferrous sulfate 325 (65 FE) MG tablet Take by mouth. 04/08/17   [provider]  fexofenadine (ALLEGRA) 180 MG tablet daily 04/08/17   [provider]  fluticasone (FLONASE) 50 MCG/ACT nasal spray Place into the nose. 04/21/18   [provider]  glucose blood (ACCU-CHEK AVIVA PLUS) test strip USE TEST STRIP(S) TO CHECK GLUCOSE ONCE DAILY 04/11/19   [provider]  insulin glargine (LANTUS SOLOSTAR) 100 UNIT/ML Solostar Pen INJECT 45 UNITS SUBCUTANEOUSLY NIGHTLY 05/09/17   [provider]  latanoprost (XALATAN) 0.005 % ophthalmic solution Apply to eye.    [provider]  lisinopril (PRINIVIL,ZESTRIL) 40 MG tablet Take 40 mg by mouth daily.    [provider]  Multiple Vitamin (MULTIVITAMIN) capsule Take by mouth.    [provider]  OneTouch Delica Lancets 33G MISC Use 1 each as directed.    [provider]  pantoprazole (PROTONIX) 40 MG tablet TAKE 1 TABLET BY MOUTH ONCE DAILY Patient not taking: Reported on 06/06/2020 09/17/17   [provider]  Pediatric Multivit-Minerals-C (FLINTSTONES GUMMIES) chewable tablet Chew by mouth. Patient not taking: Reported on 06/06/2020    [provider]  polyethylene glycol (MIRALAX / GLYCOLAX) 17 g packet Take by mouth.    [provider]  pravastatin (PRAVACHOL) 20 MG tablet  04/27/19  [provider]  VICTOZA 18 MG/3ML SOPN Inject into the skin.    [provider]    Allergies as of 05/22/2022   (No Known Allergies)    Family History  Problem Relation Age of Onset   Pancreatic cancer Mother    Lung cancer Father    Breast cancer Sister 63   Breast cancer Sister 78   Breast cancer Sister 53   Lung cancer Brother    Breast cancer Other     Social History   Socioeconomic History   Marital status: Married     Spouse name: Not on file   Number of children: Not on file   Years of education: Not on file   Highest education level: Not on file  Occupational History   Not on file  Tobacco Use   Smoking status: Former   Smokeless tobacco: Never  Vaping Use   Vaping Use: Never used  Substance and Sexual Activity   Alcohol use: No   Drug use: No   Sexual activity: Not on file  Other Topics Concern   Not on file  Social History Narrative   Not on file   Social Determinants of Health   Financial Resource Strain: Not on file  Food Insecurity: Not on file  Transportation Needs: Not on file  Physical Activity: Not on file  Stress: Not on file  Social Connections: Not on file  Intimate Partner Violence: Not on file    Review of Systems: See HPI, otherwise negative ROS  Physical Exam: BP (!) 181/78   Pulse 94   Temp (!) 97 F (36.1 C) (Temporal)   Resp 16   Ht 5\' 2"  (1.575 m)   Wt 81.6 kg   SpO2 99%   BMI 32.92 kg/m  General:   Alert,  pleasant and cooperative in NAD Head:  Normocephalic and atraumatic. Neck:  Supple; no masses or thyromegaly. Lungs:  Clear throughout to auscultation, normal respiratory effort.    Heart:  +S1, +S2, Regular rate and rhythm, No edema. Abdomen:  Soft, nontender and nondistended. Normal bowel sounds, without guarding, and without rebound.   Neurologic:  Alert and  oriented x4;  grossly normal neurologically.  Impression/Plan: Karla Hopkins is here for an colonoscopy to be performed for surveillance due to prior history of colon polyps   Risks, benefits, limitations, and alternatives regarding  colonoscopy have been reviewed with the patient.  Questions have been answered.  All parties agreeable.   Wyline Mood, MD  08/05/2022, 9:41 AM

## 2022-08-06 ENCOUNTER — Encounter: Payer: Self-pay | Admitting: Gastroenterology

## 2022-08-20 NOTE — Anesthesia Postprocedure Evaluation (Signed)
Anesthesia Post Note  Patient: Karla Hopkins  Procedure(s) Performed: COLONOSCOPY WITH PROPOFOL POLYPECTOMY  Patient location during evaluation: PACU Anesthesia Type: General Level of consciousness: awake and alert Pain management: pain level controlled Vital Signs Assessment: post-procedure vital signs reviewed and stable Respiratory status: spontaneous breathing, nonlabored ventilation, respiratory function stable and patient connected to nasal cannula oxygen Cardiovascular status: blood pressure returned to baseline and stable Postop Assessment: no apparent nausea or vomiting Anesthetic complications: no   No notable events documented.   Last Vitals:  Vitals:   08/05/22 1032 08/05/22 1042  BP: (!) 146/68 (!) 159/74  Pulse: 70 78  Resp: 18 19  Temp:  (!) 35.7 C  SpO2: 100% 99%    Last Pain:  Vitals:   08/05/22 1042  TempSrc: Temporal  PainSc: 0-No pain                 Yevette Edwards

## 2022-12-22 IMAGING — MG MM DIGITAL SCREENING BILAT W/ TOMO AND CAD
6 of 10 series · 6 of 30 positions shown · non-contrast
Comparison: Previous exam(s).

CLINICAL DATA: Screening.

EXAM:
DIGITAL SCREENING BILATERAL MAMMOGRAM WITH TOMOSYNTHESIS AND CAD
TECHNIQUE: Bilateral screening digital craniocaudal and mediolateral oblique
mammograms were obtained. Bilateral screening digital breast
tomosynthesis was performed. The images were evaluated with
computer-aided detection.

[R MLO synth-2D (1 of 2)]
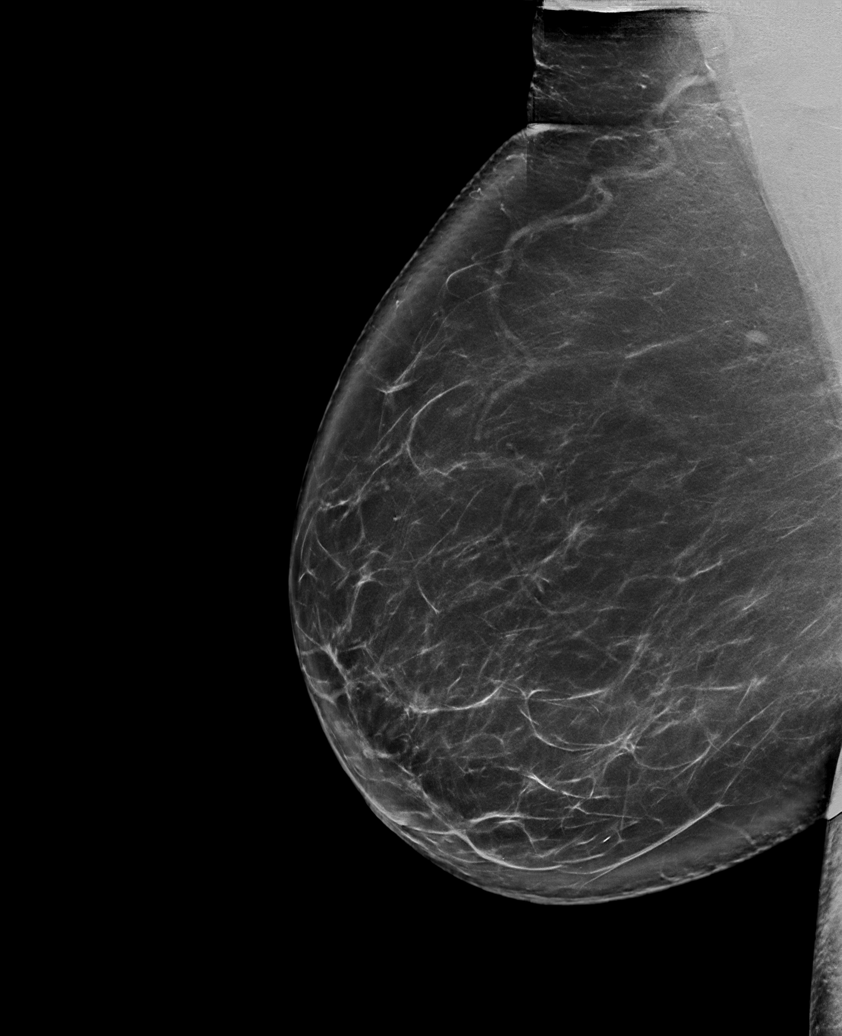

[L MLO synth-2D]
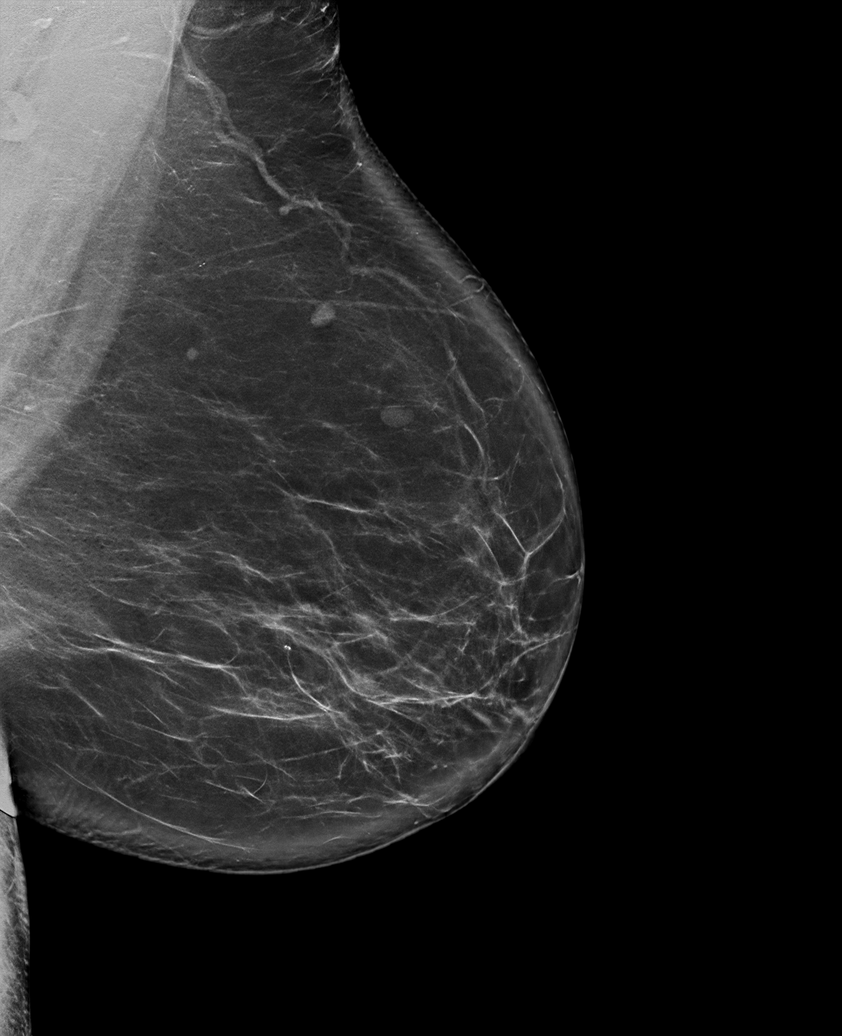

[R CC synth-2D]
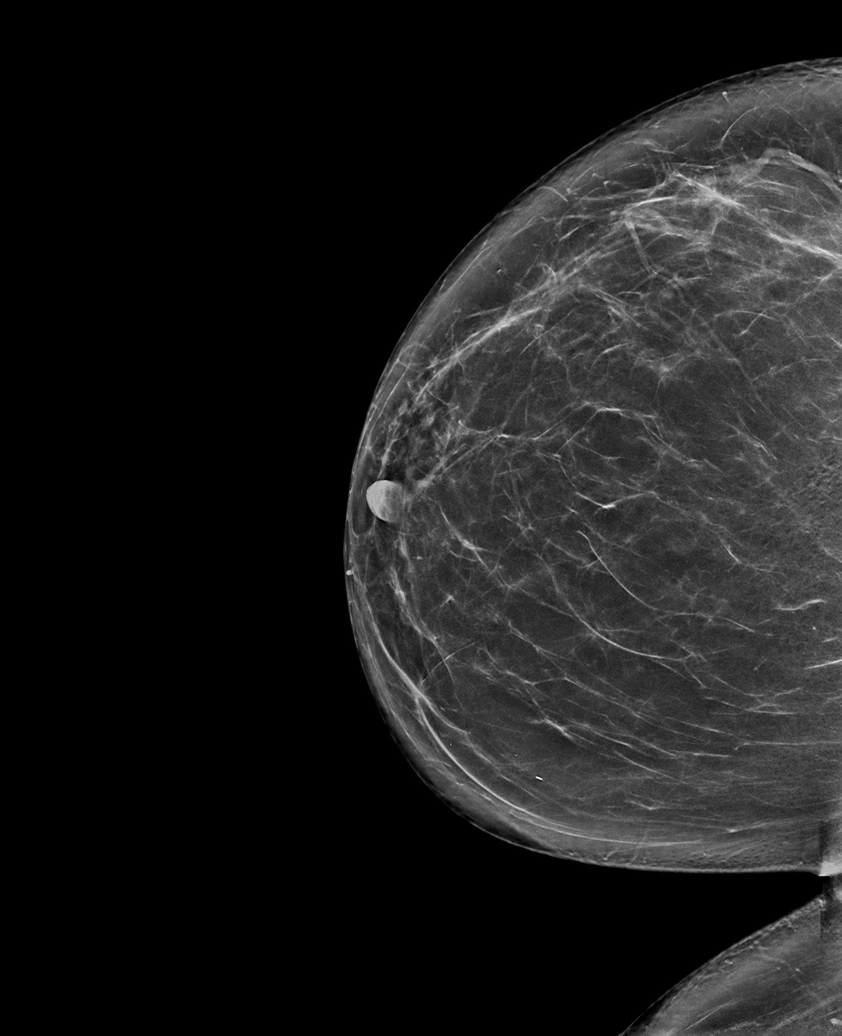

[L CC synth-2D]
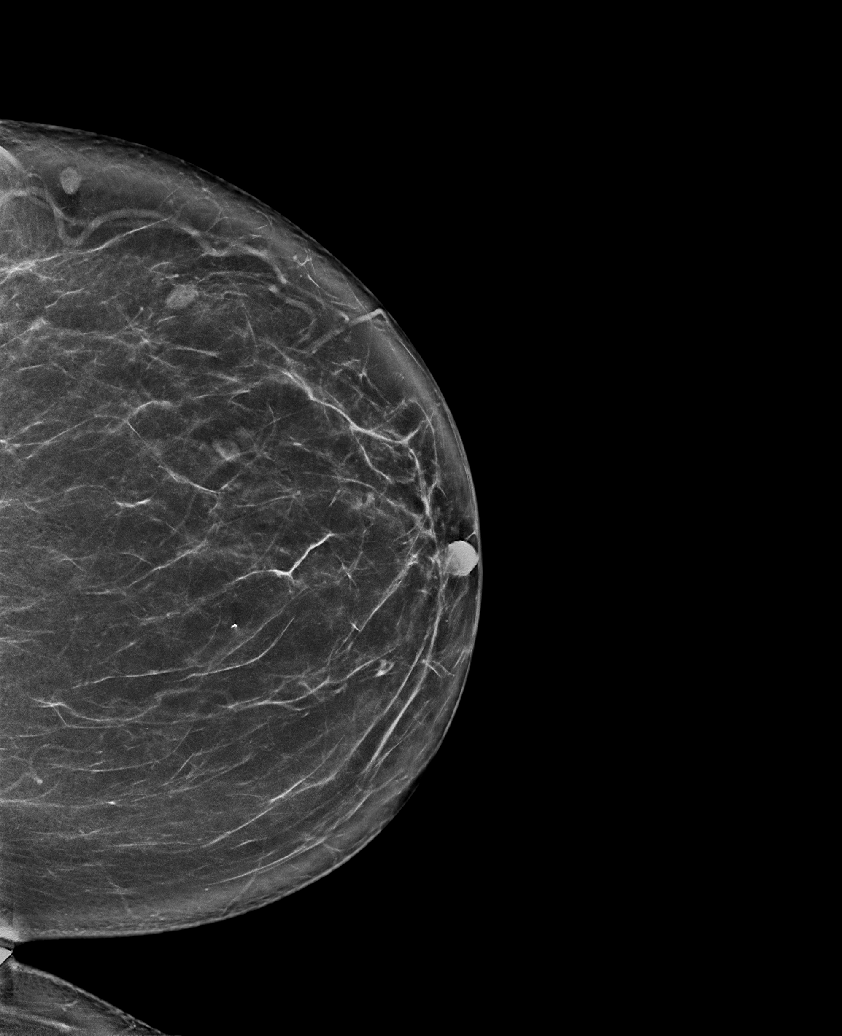

[R MLO synth-2D (2 of 2)]
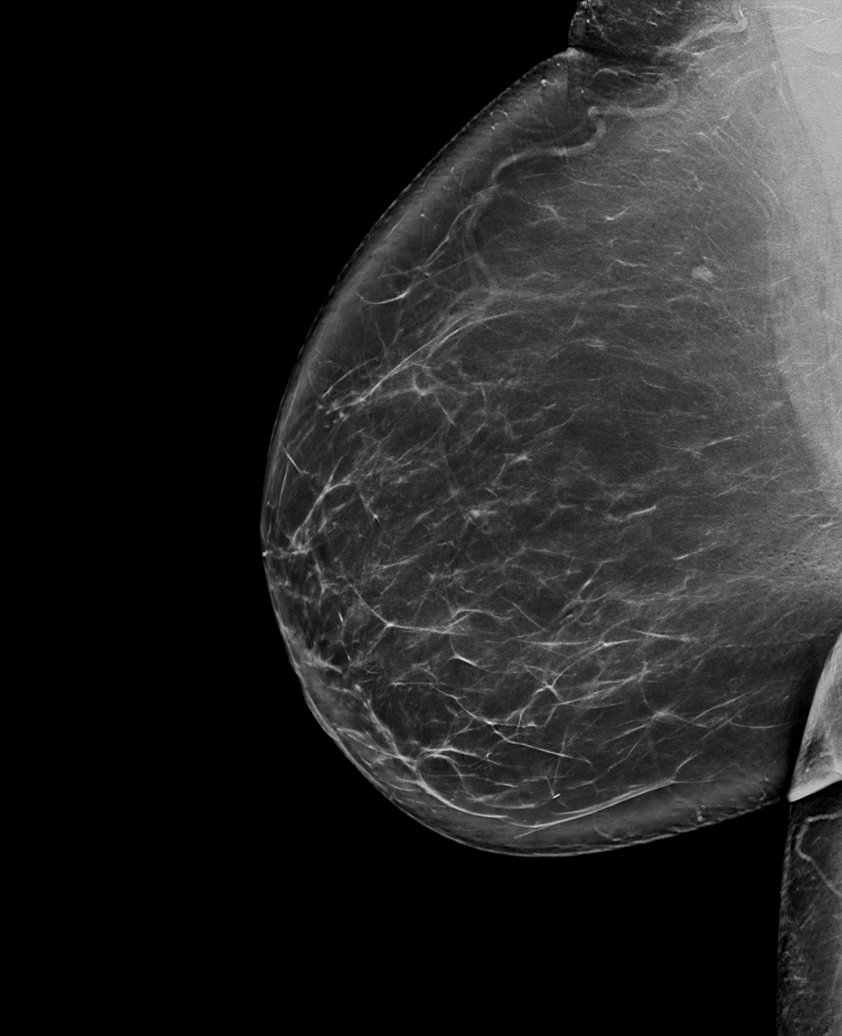

[L MLO tomo · tomo slice 52/103.0]
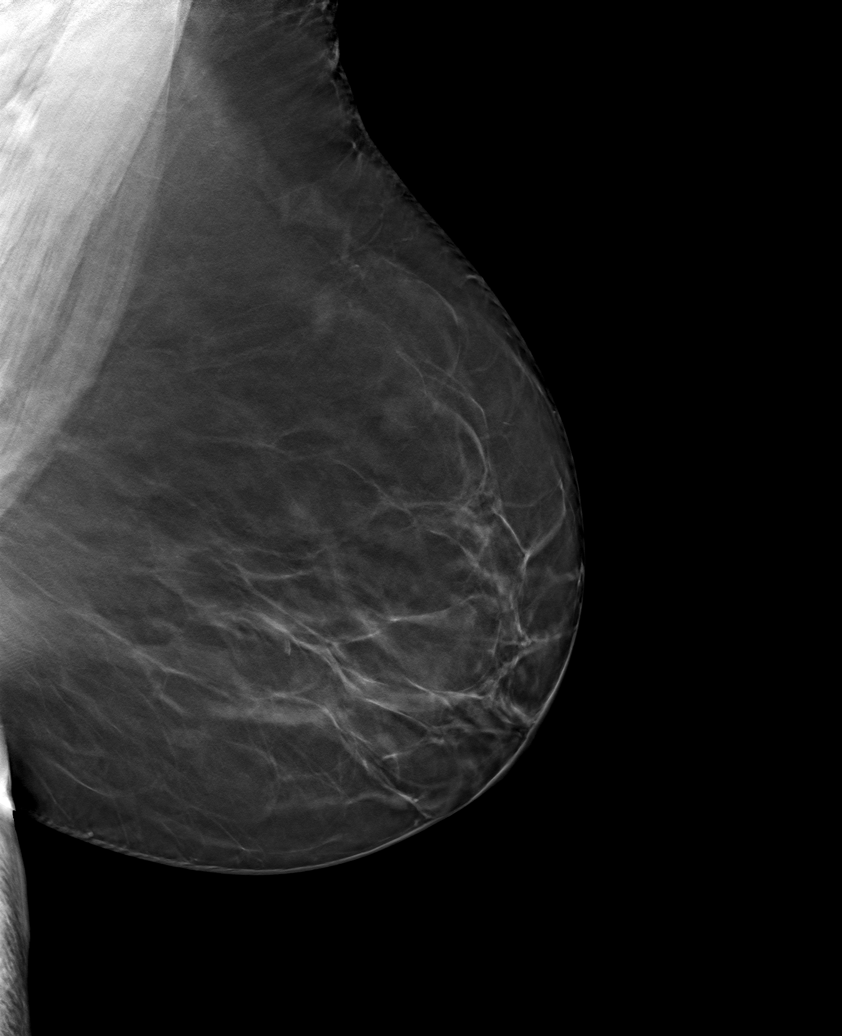

[6 of 30 positions shown; findings below may reference images not displayed]

ACR Breast Density Category b: There are scattered areas of
fibroglandular density.
FINDINGS: There are no findings suspicious for malignancy. The images were
evaluated with computer-aided detection.
IMPRESSION: No mammographic evidence of malignancy. A result letter of this
screening mammogram will be mailed directly to the patient.

RECOMMENDATION:
1.  Screening mammogram in one year. (Code:1O-5-YY3)

2. Breast cancer risk assessment given family history. If patient is
at elevated lifetime risk (greater than 20%), annual screening
breast MRI is recommended in addition to annual screening
mammography.

BI-RADS CATEGORY  1: Negative.

## 2023-05-23 ENCOUNTER — Other Ambulatory Visit: Payer: Self-pay | Admitting: Family Medicine

## 2023-05-23 DIAGNOSIS — Z1231 Encounter for screening mammogram for malignant neoplasm of breast: Secondary | ICD-10-CM

## 2023-06-19 ENCOUNTER — Ambulatory Visit
Admission: RE | Admit: 2023-06-19 | Discharge: 2023-06-19 | Disposition: A | Discharge status: Home / Self Care | Source: Ambulatory Visit | Attending: Family Medicine | Admitting: Family Medicine

## 2023-06-19 DIAGNOSIS — Z1231 Encounter for screening mammogram for malignant neoplasm of breast: Secondary | ICD-10-CM | POA: Insufficient documentation

## 2024-02-21 ENCOUNTER — Emergency Department

## 2024-02-21 ENCOUNTER — Emergency Department
Admission: EM | Admit: 2024-02-21 | Discharge: 2024-02-21 | Disposition: A | Discharge status: Home / Self Care | Attending: Emergency Medicine | Admitting: Emergency Medicine

## 2024-02-21 ENCOUNTER — Other Ambulatory Visit: Payer: Self-pay

## 2024-02-21 DIAGNOSIS — Z8616 Personal history of COVID-19: Secondary | ICD-10-CM | POA: Insufficient documentation

## 2024-02-21 DIAGNOSIS — I10 Essential (primary) hypertension: Secondary | ICD-10-CM | POA: Insufficient documentation

## 2024-02-21 DIAGNOSIS — R000 Tachycardia, unspecified: Secondary | ICD-10-CM | POA: Insufficient documentation

## 2024-02-21 DIAGNOSIS — E119 Type 2 diabetes mellitus without complications: Secondary | ICD-10-CM | POA: Insufficient documentation

## 2024-02-21 LAB — RESP PANEL BY RT-PCR (RSV, FLU A&B, COVID)  RVPGX2
Influenza A by PCR: NEGATIVE
Influenza B by PCR: NEGATIVE
Resp Syncytial Virus by PCR: NEGATIVE
SARS Coronavirus 2 by RT PCR: NEGATIVE

## 2024-02-21 LAB — CBC
HCT: 36.6 % (ref 36.0–46.0)
Hemoglobin: 11.9 g/dL — ABNORMAL LOW (ref 12.0–15.0)
MCH: 29.5 pg (ref 26.0–34.0)
MCHC: 32.5 g/dL (ref 30.0–36.0)
MCV: 90.8 fL (ref 80.0–100.0)
Platelets: 265 10*3/uL (ref 150–400)
RBC: 4.03 MIL/uL (ref 3.87–5.11)
RDW: 14.6 % (ref 11.5–15.5)
WBC: 6 10*3/uL (ref 4.0–10.5)
nRBC: 0 % (ref 0.0–0.2)

## 2024-02-21 LAB — BASIC METABOLIC PANEL WITH GFR
Anion gap: 11 (ref 5–15)
BUN: 7 mg/dL — ABNORMAL LOW (ref 8–23)
CO2: 26 mmol/L (ref 22–32)
Calcium: 9.6 mg/dL (ref 8.9–10.3)
Chloride: 103 mmol/L (ref 98–111)
Creatinine, Ser: 0.48 mg/dL (ref 0.44–1.00)
GFR, Estimated: >60 mL/min
Glucose, Bld: 178 mg/dL — ABNORMAL HIGH (ref 70–99)
Potassium: 3.4 mmol/L — ABNORMAL LOW (ref 3.5–5.1)
Sodium: 140 mmol/L (ref 135–145)

## 2024-02-21 LAB — URINALYSIS, ROUTINE W REFLEX MICROSCOPIC
Bilirubin Urine: NEGATIVE
Glucose, UA: NEGATIVE mg/dL
Hgb urine dipstick: NEGATIVE
Ketones, ur: NEGATIVE mg/dL
Leukocytes,Ua: NEGATIVE
Nitrite: NEGATIVE
Protein, ur: NEGATIVE mg/dL
Specific Gravity, Urine: 1.01 (ref 1.005–1.030)
pH: 8 (ref 5.0–8.0)

## 2024-02-21 LAB — TROPONIN T, HIGH SENSITIVITY: Troponin T High Sensitivity: 14 ng/L (ref 0–19)

## 2024-02-21 MED ORDER — HYDRALAZINE HCL 50 MG PO TABS: 100.0000 mg | ORAL_TABLET | Freq: Once | ORAL | Status: DC

## 2024-02-21 MED ORDER — LISINOPRIL 10 MG PO TABS: 40.0000 mg | ORAL_TABLET | Freq: Once | ORAL | Status: DC

## 2024-02-21 MED ORDER — AMLODIPINE BESYLATE 5 MG PO TABS: 10.0000 mg | ORAL_TABLET | Freq: Every day | ORAL | Status: DC

## 2024-02-21 MED ORDER — DOXAZOSIN MESYLATE 4 MG PO TABS
8.0000 mg | ORAL_TABLET | Freq: Once | ORAL | Status: DC
  Filled 2024-02-21: qty 2

## 2024-02-21 MED ORDER — HYDROCHLOROTHIAZIDE 25 MG PO TABS: 25.0000 mg | ORAL_TABLET | Freq: Once | ORAL | Status: DC

## 2024-02-21 NOTE — ED Triage Notes (Addendum)
 Pt arrives via POV with c/o HTN and a fast heartbeat that they noticed last night. Pt states that they take BP medications and is compliant with medications, has not taken today's dose. Pt states that they were prescribed clonidine this past week to help with their BP. Pt denies CP, SOB. Pt is A&Ox4 and ambulatory during triage.  Pt states that they were dx with covid around [place] and that the last time they had covid their BP was also really high.

## 2024-02-21 NOTE — ED Provider Notes (Signed)
 "  [facility] Provider Note    Event Date/Time   First MD Initiated Contact with Patient 02/21/24 [phone]     (approximate)  History   Chief Complaint: Hypertension and Tachycardia  HPI  ALLIX BLOMQUIST is a 69 y.o. female with a past medical history of diabetes, hypertension, hyperlipidemia, presents to the emergency department with concerns for high blood pressure had an elevated heart rate.  According to the patient last night she checked her blood pressure and it was elevated greater than 200 and the pulse rate was around 100-110.  Patient states this morning her pulse rate was still elevated so she came to the emergency department for evaluation.  Patient did not take her morning blood pressure medications.  Patient denies any chest pain no shortness of breath no weakness.  Patient states she did recently get diagnosed with COVID approximately 3 to 4 weeks ago but has largely resolved.  Patient states during her last COVID diagnosis her blood pressure elevated as well.  Physical Exam   Triage Vital Signs: ED Triage Vitals  Encounter Vitals Group     BP 02/21/24 0727 (!) 201/70     Girls Systolic BP Percentile --      Girls Diastolic BP Percentile --      Boys Systolic BP Percentile --      Boys Diastolic BP Percentile --      Pulse Rate 02/21/24 0727 (!) 111     Resp 02/21/24 0727 18     Temp 02/21/24 0727 98.4 F (36.9 C)     Temp Source 02/21/24 0727 Oral     SpO2 02/21/24 0727 98 %     Weight 02/21/24 0730 177 lb (80.3 kg)     Height 02/21/24 0730 5' 2 (1.575 m)     Head Circumference --      Peak Flow --      Pain Score 02/21/24 0727 0     Pain Loc --      Pain Education --      Exclude from Growth Chart --     Most recent vital signs: Vitals:   02/21/24 0727 02/21/24 0800  BP: (!) 201/70 (!) 168/74  Pulse: (!) 111 98  Resp: 18 (!) 23  Temp: 98.4 F (36.9 C)   SpO2: 98% 100%    General: Awake, no distress.  CV:  Good peripheral  perfusion.  Regular rate and rhythm  Resp:  Normal effort.  Equal breath sounds bilaterally.  Abd:  No distention.  Soft, nontender.  No rebound or guarding.  ED Results / Procedures / Treatments   EKG  EKG viewed and interpreted by myself shows sinus tachycardia at 105 bpm with a narrow QRS, normal axis, normal intervals, no concerning ST changes.  RADIOLOGY  I have reviewed and interpreted the chest x-ray images.  No consolidation on my evaluation. Radiology has read the x-ray as negative.   MEDICATIONS ORDERED IN ED: Medications  doxazosin  (CARDURA ) tablet 8 mg (has no administration in time range)  hydrochlorothiazide  (HYDRODIURIL ) tablet 25 mg (has no administration in time range)  lisinopril  (ZESTRIL ) tablet 40 mg (has no administration in time range)  amLODipine  (NORVASC ) tablet 10 mg (has no administration in time range)  hydrALAZINE  (APRESOLINE ) tablet 100 mg (has no administration in time range)     IMPRESSION / MDM / ASSESSMENT AND PLAN / ED COURSE  I reviewed the triage vital signs and the nursing notes.  Patient's presentation is most consistent  with acute presentation with potential threat to life or bodily function.  Patient presents to the emergency department for high blood pressure and elevated pulse rate.  According to the patient she checked her blood pressure last night and it was elevated greater than 200 her pulse rate was also reading high around 110 bpm.  Patient states this morning her pulse rate was still high so she came to the emergency department did not take any of her morning medications.  I reviewed the patient's most recent cardiology note.  I have reordered the patient's morning home medications for her.  Blood pressure currently 168/74.  Pulse rate around 98 bpm. Patient actually has all of her morning medications with her.  Will allow the patient to take her morning medications (hydralazine , hydrochlorothiazide , lisinopril , amlodipine ,  Pradaxa).  Patient's workup is reassuring normal CBC reassuring chemistry negative troponin normal urinalysis negative respiratory panel.  Blood pressure down to 160/69 pulse rate maintaining around 80 bpm.  Patient reassured by the workup.  Will discharge patient home with outpatient follow-up.  FINAL CLINICAL IMPRESSION(S) / ED DIAGNOSES   Hypertension Tachycardia   Note:  This document was prepared using Dragon voice recognition software and may include unintentional dictation errors.   Dorothyann Drivers, MD 02/21/24 1153  "

## 2024-04-07 ENCOUNTER — Encounter: Admission: RE | Payer: Self-pay | Source: Home / Self Care

## 2024-04-07 ENCOUNTER — Ambulatory Visit: Admission: RE | Admit: 2024-04-07 | Source: Home / Self Care | Admitting: Ophthalmology

## 2024-04-21 ENCOUNTER — Ambulatory Visit: Admit: 2024-04-21 | Admitting: Ophthalmology
